# Patient Record
Sex: Female | Born: 1960 | Race: Black or African American | Hispanic: No | Smoking: Current some day smoker
Health system: Southern US, Community
[De-identification: ages and names within clinical notes are randomized; demographics above are authoritative.]

## PROBLEM LIST (undated history)

## (undated) DIAGNOSIS — I639 Cerebral infarction, unspecified: Secondary | ICD-10-CM

## (undated) DIAGNOSIS — R911 Solitary pulmonary nodule: Secondary | ICD-10-CM

## (undated) DIAGNOSIS — K219 Gastro-esophageal reflux disease without esophagitis: Secondary | ICD-10-CM

## (undated) DIAGNOSIS — E669 Obesity, unspecified: Secondary | ICD-10-CM

## (undated) DIAGNOSIS — I10 Essential (primary) hypertension: Secondary | ICD-10-CM

## (undated) DIAGNOSIS — I779 Disorder of arteries and arterioles, unspecified: Secondary | ICD-10-CM

## (undated) DIAGNOSIS — E119 Type 2 diabetes mellitus without complications: Secondary | ICD-10-CM

## (undated) DIAGNOSIS — Z8673 Personal history of transient ischemic attack (TIA), and cerebral infarction without residual deficits: Secondary | ICD-10-CM

## (undated) DIAGNOSIS — E782 Mixed hyperlipidemia: Secondary | ICD-10-CM

## (undated) DIAGNOSIS — E559 Vitamin D deficiency, unspecified: Secondary | ICD-10-CM

## (undated) DIAGNOSIS — M1990 Unspecified osteoarthritis, unspecified site: Secondary | ICD-10-CM

## (undated) DIAGNOSIS — I739 Peripheral vascular disease, unspecified: Secondary | ICD-10-CM

## (undated) HISTORY — DX: Type 2 diabetes mellitus without complications: E11.9

## (undated) HISTORY — DX: Vitamin D deficiency, unspecified: E55.9

## (undated) HISTORY — DX: Obesity, unspecified: E66.9

## (undated) HISTORY — PX: TUBAL LIGATION: SHX77

## (undated) HISTORY — DX: Solitary pulmonary nodule: R91.1

## (undated) HISTORY — DX: Personal history of transient ischemic attack (TIA), and cerebral infarction without residual deficits: Z86.73

## (undated) HISTORY — DX: Disorder of arteries and arterioles, unspecified: I77.9

## (undated) HISTORY — DX: Essential (primary) hypertension: I10

## (undated) HISTORY — DX: Mixed hyperlipidemia: E78.2

## (undated) HISTORY — DX: Peripheral vascular disease, unspecified: I73.9

---

## 2012-01-31 ENCOUNTER — Emergency Department: Payer: Self-pay | Admitting: Internal Medicine

## 2012-01-31 LAB — CBC
HCT: 43.7 %
HGB: 14.8 g/dL
MCH: 30.7 pg
MCHC: 33.9 g/dL
MCV: 91 fL
Platelet: 125 x10 3/mm 3 — ABNORMAL LOW
RBC: 4.81 X10 6/mm 3
RDW: 13.1 %
WBC: 3.8 x10 3/mm 3

## 2012-01-31 LAB — URINALYSIS, COMPLETE
Bacteria: NONE SEEN
Bilirubin,UR: NEGATIVE
Blood: NEGATIVE
Glucose,UR: >=500 mg/dL
Ketone: NEGATIVE
Leukocyte Esterase: NEGATIVE
Nitrite: NEGATIVE
Ph: 5
Protein: NEGATIVE
RBC,UR: NONE SEEN /HPF
Specific Gravity: 1.009
Squamous Epithelial: <1
WBC UR: 1 /HPF

## 2012-01-31 LAB — COMPREHENSIVE METABOLIC PANEL WITH GFR
Albumin: 3.9 g/dL
Alkaline Phosphatase: 124 U/L
Anion Gap: 12
BUN: 15 mg/dL
Bilirubin,Total: 0.4 mg/dL
Calcium, Total: 9.4 mg/dL
Chloride: 101 mmol/L
Co2: 21 mmol/L
Creatinine: 0.76 mg/dL
EGFR (African American): >60
EGFR (Non-African Amer.): >60
Glucose: 307 mg/dL — ABNORMAL HIGH
Osmolality: 281
Potassium: 4 mmol/L
SGOT(AST): 21 U/L
SGPT (ALT): 24 U/L
Sodium: 134 mmol/L — ABNORMAL LOW
Total Protein: 8.5 g/dL — ABNORMAL HIGH

## 2012-01-31 LAB — PROTIME-INR
INR: 0.9
Prothrombin Time: 12.5 secs (ref 11.5–14.7)

## 2012-09-20 ENCOUNTER — Inpatient Hospital Stay (HOSPITAL_COMMUNITY)
Admission: EM | Admit: 2012-09-20 | Discharge: 2012-09-22 | DRG: 066 | Disposition: A | Discharge status: Home Health | Payer: Medicaid Other | Attending: Internal Medicine | Admitting: Internal Medicine

## 2012-09-20 ENCOUNTER — Encounter (HOSPITAL_COMMUNITY): Payer: Self-pay | Admitting: Emergency Medicine

## 2012-09-20 ENCOUNTER — Emergency Department (HOSPITAL_COMMUNITY): Payer: Medicaid Other

## 2012-09-20 ENCOUNTER — Inpatient Hospital Stay (HOSPITAL_COMMUNITY): Payer: Medicaid Other

## 2012-09-20 DIAGNOSIS — R209 Unspecified disturbances of skin sensation: Secondary | ICD-10-CM | POA: Diagnosis present

## 2012-09-20 DIAGNOSIS — Z6833 Body mass index (BMI) 33.0-33.9, adult: Secondary | ICD-10-CM

## 2012-09-20 DIAGNOSIS — Z79899 Other long term (current) drug therapy: Secondary | ICD-10-CM

## 2012-09-20 DIAGNOSIS — I63239 Cerebral infarction due to unspecified occlusion or stenosis of unspecified carotid arteries: Principal | ICD-10-CM | POA: Diagnosis present

## 2012-09-20 DIAGNOSIS — R4789 Other speech disturbances: Secondary | ICD-10-CM | POA: Diagnosis present

## 2012-09-20 DIAGNOSIS — Z7982 Long term (current) use of aspirin: Secondary | ICD-10-CM

## 2012-09-20 DIAGNOSIS — E785 Hyperlipidemia, unspecified: Secondary | ICD-10-CM | POA: Diagnosis present

## 2012-09-20 DIAGNOSIS — R29810 Facial weakness: Secondary | ICD-10-CM | POA: Diagnosis present

## 2012-09-20 DIAGNOSIS — I10 Essential (primary) hypertension: Secondary | ICD-10-CM | POA: Diagnosis present

## 2012-09-20 DIAGNOSIS — I6350 Cerebral infarction due to unspecified occlusion or stenosis of unspecified cerebral artery: Secondary | ICD-10-CM

## 2012-09-20 DIAGNOSIS — E669 Obesity, unspecified: Secondary | ICD-10-CM | POA: Diagnosis present

## 2012-09-20 DIAGNOSIS — I639 Cerebral infarction, unspecified: Secondary | ICD-10-CM

## 2012-09-20 DIAGNOSIS — N320 Bladder-neck obstruction: Secondary | ICD-10-CM

## 2012-09-20 DIAGNOSIS — F17200 Nicotine dependence, unspecified, uncomplicated: Secondary | ICD-10-CM | POA: Diagnosis present

## 2012-09-20 DIAGNOSIS — E119 Type 2 diabetes mellitus without complications: Secondary | ICD-10-CM | POA: Diagnosis present

## 2012-09-20 DIAGNOSIS — I6930 Unspecified sequelae of cerebral infarction: Secondary | ICD-10-CM

## 2012-09-20 DIAGNOSIS — R29898 Other symptoms and signs involving the musculoskeletal system: Secondary | ICD-10-CM | POA: Diagnosis present

## 2012-09-20 DIAGNOSIS — I69998 Other sequelae following unspecified cerebrovascular disease: Secondary | ICD-10-CM

## 2012-09-20 HISTORY — DX: Essential (primary) hypertension: I10

## 2012-09-20 HISTORY — DX: Type 2 diabetes mellitus without complications: E11.9

## 2012-09-20 LAB — CBC
Hemoglobin: 11.7 g/dL — ABNORMAL LOW (ref 12.0–15.0)
MCV: 83.7 fL (ref 78.0–100.0)
Platelets: 209 10*3/uL (ref 150–400)
RBC: 4.06 MIL/uL (ref 3.87–5.11)
WBC: 5.4 10*3/uL (ref 4.0–10.5)

## 2012-09-20 LAB — COMPREHENSIVE METABOLIC PANEL
AST: 14 U/L (ref 0–37)
CO2: 25 mEq/L (ref 19–32)
Calcium: 9.5 mg/dL (ref 8.4–10.5)
Chloride: 101 mEq/L (ref 96–112)
Creatinine, Ser: 0.77 mg/dL (ref 0.50–1.10)
GFR calc Af Amer: >90 mL/min (ref >90)
GFR calc non Af Amer: >90 mL/min (ref >90)
Glucose, Bld: 120 mg/dL — ABNORMAL HIGH (ref 70–99)
Total Bilirubin: 0.4 mg/dL (ref 0.3–1.2)

## 2012-09-20 LAB — CBC WITH DIFFERENTIAL/PLATELET
Eosinophils Relative: 1 % (ref 0–5)
HCT: 32.3 % — ABNORMAL LOW (ref 36.0–46.0)
Hemoglobin: 11.2 g/dL — ABNORMAL LOW (ref 12.0–15.0)
Lymphocytes Relative: 34 % (ref 12–46)
Lymphs Abs: 1.7 10*3/uL (ref 0.7–4.0)
MCV: 84.3 fL (ref 78.0–100.0)
Monocytes Absolute: 0.4 10*3/uL (ref 0.1–1.0)
Monocytes Relative: 7 % (ref 3–12)
Neutro Abs: 2.9 10*3/uL (ref 1.7–7.7)
RBC: 3.83 MIL/uL — ABNORMAL LOW (ref 3.87–5.11)
WBC: 5 10*3/uL (ref 4.0–10.5)

## 2012-09-20 LAB — CREATININE, SERUM
Creatinine, Ser: 0.66 mg/dL (ref 0.50–1.10)
GFR calc Af Amer: >90 mL/min (ref >90)

## 2012-09-20 LAB — GLUCOSE, CAPILLARY

## 2012-09-20 LAB — PROTIME-INR: INR: 0.89 (ref 0.00–1.49)

## 2012-09-20 LAB — ETHANOL: Alcohol, Ethyl (B): <11 mg/dL (ref 0–11)

## 2012-09-20 MED ORDER — SIMVASTATIN 20 MG PO TABS
20.0000 mg | ORAL_TABLET | Freq: Every day | ORAL | Status: DC
  Administered 2012-09-21: 20 mg via ORAL
  Filled 2012-09-20 (×2): qty 1

## 2012-09-20 MED ORDER — ACETAMINOPHEN 650 MG RE SUPP: 650.0000 mg | RECTAL | Status: DC | PRN

## 2012-09-20 MED ORDER — LISINOPRIL 10 MG PO TABS
10.0000 mg | ORAL_TABLET | Freq: Every day | ORAL | Status: DC
  Administered 2012-09-21 – 2012-09-22 (×2): 10 mg via ORAL
  Filled 2012-09-20 (×2): qty 1

## 2012-09-20 MED ORDER — ASPIRIN 300 MG RE SUPP
300.0000 mg | Freq: Every day | RECTAL | Status: DC
  Filled 2012-09-20 (×2): qty 1

## 2012-09-20 MED ORDER — INSULIN ASPART 100 UNIT/ML ~~LOC~~ SOLN: 0.0000 [IU] | Freq: Every day | SUBCUTANEOUS | Status: DC

## 2012-09-20 MED ORDER — ENOXAPARIN SODIUM 40 MG/0.4ML ~~LOC~~ SOLN
40.0000 mg | SUBCUTANEOUS | Status: DC
  Administered 2012-09-20 – 2012-09-21 (×2): 40 mg via SUBCUTANEOUS
  Filled 2012-09-20 (×3): qty 0.4

## 2012-09-20 MED ORDER — ONDANSETRON HCL 4 MG/2ML IJ SOLN: 4.0000 mg | Freq: Four times a day (QID) | INTRAMUSCULAR | Status: DC | PRN

## 2012-09-20 MED ORDER — ACETAMINOPHEN 325 MG PO TABS: 650.0000 mg | ORAL_TABLET | ORAL | Status: DC | PRN

## 2012-09-20 MED ORDER — SODIUM CHLORIDE 0.9 % IV SOLN
INTRAVENOUS | Status: AC
  Administered 2012-09-20: 19:00:00 via INTRAVENOUS

## 2012-09-20 MED ORDER — INSULIN ASPART 100 UNIT/ML ~~LOC~~ SOLN
0.0000 [IU] | Freq: Three times a day (TID) | SUBCUTANEOUS | Status: DC
  Administered 2012-09-21: 3 [IU] via SUBCUTANEOUS
  Administered 2012-09-21: 2 [IU] via SUBCUTANEOUS
  Administered 2012-09-21 – 2012-09-22 (×2): 1 [IU] via SUBCUTANEOUS

## 2012-09-20 MED ORDER — GABAPENTIN 100 MG PO CAPS
100.0000 mg | ORAL_CAPSULE | Freq: Three times a day (TID) | ORAL | Status: DC
  Administered 2012-09-20 – 2012-09-22 (×5): 100 mg via ORAL
  Filled 2012-09-20 (×8): qty 1

## 2012-09-20 MED ORDER — ASPIRIN 325 MG PO TABS
325.0000 mg | ORAL_TABLET | Freq: Every day | ORAL | Status: DC
  Administered 2012-09-20 – 2012-09-21 (×2): 325 mg via ORAL
  Filled 2012-09-20 (×2): qty 1

## 2012-09-20 NOTE — Consult Note (Signed)
Referring Physician: ED    Chief Complaint: right face weakness.  HPI:                                                                                                                                         Felicia Chavez is an 52 y.o. 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 She said that she didn't have a headache, vertigo, double vision, worsening speech, numbness, increasing right arm weakness or right leg weakness. She stopped taking her aspirin for an unknown period of time. Upon arrival to the ED an urgent CT brain showed an area of low attenuation involving the right frontal area, and a subsequent MRI-DWI brain revealed an acute infarct left frontal white matter and overlying  cortex that extends back to the parietal lobe. Brain MRA showed severe stenosis of the supraclinoid internal carotid artery on the  left with decreased flow in the left M1 and A1 segments.  No other complains today.      LSN: 11: am today. tPA Given: no a candidate due to late presentation.  Past Medical History  Diagnosis Date  . Hypertension   . Diabetes mellitus without complication     History reviewed. No pertinent past surgical history.  History reviewed. No pertinent family history. Social History:  reports that she has never smoked. She does not have any smokeless tobacco history on file. She reports that she does not drink alcohol or use illicit drugs.  Allergies: No Known Allergies  Medications:                                                                                                                           I have reviewed the patient's current  medications.  ROS:  History obtained from the patient and family members.  General ROS: negative for - chills, fatigue, fever, night sweats, weight gain or weight loss Psychological ROS: negative for - behavioral disorder, hallucinations, memory difficulties, mood swings or suicidal ideation Ophthalmic ROS: negative for - blurry vision, double vision, eye pain or loss of vision ENT ROS: negative for - epistaxis, nasal discharge, oral lesions, sore throat, tinnitus or vertigo Allergy and Immunology ROS: negative for - hives or itchy/watery eyes Hematological and Lymphatic ROS: negative for - bleeding problems, bruising or swollen lymph nodes Endocrine ROS: negative for - galactorrhea, hair pattern changes, polydipsia/polyuria or temperature intolerance Respiratory ROS: negative for - cough, hemoptysis, shortness of breath or wheezing Cardiovascular ROS: negative for - chest pain, dyspnea on exertion, edema or irregular heartbeat Gastrointestinal ROS: negative for - abdominal pain, diarrhea, hematemesis, nausea/vomiting or stool incontinence Genito-Urinary ROS: negative for - dysuria, hematuria, incontinence or urinary frequency/urgency Musculoskeletal ROS: negative for - joint swelling or muscular weakness Neurological ROS: as noted in HPI Dermatological ROS: negative for rash and skin lesion changes.  Physical exam: pleasant, no distress.Blood pressure 132/69, pulse 94, temperature 98.4 F (36.9 C), temperature source Oral, resp. rate 20, SpO2 100.00%. Head: normocephalic. Neck: supple. Cardiac: no murmurs. Lungs: clear. Abdomen: soft. Extremities: no edema.  Neurologic Examination:                                                                                                      Mental Status: Alert, oriented, thought content appropriate. No aphasia  but subtle dysarthria. Able to follow 3 step commands without difficulty. Cranial Nerves: II: Discs flat bilaterally; Visual fields grossly normal, pupils equal, round, reactive to light and accommodation III,IV, VI: ptosis not present, extra-ocular motions intact bilaterally V: facial light touch sensation normal bilaterally. VII: right facial weakness, central type. VIII: hearing normal bilaterally IX,X: gag reflex present XI: bilateral shoulder shrug XII: midline tongue extension Motor: Right : mild right hand weakness.   Left:     Upper extremity   5/5 Lower extremity   5/5     Left lower extremity   5/5 Tone and bulk:normal tone throughout; no atrophy noted Sensory: Pinprick and light touch intact throughout, bilaterally Deep Tendon Reflexes: 2+ and symmetric throughout Plantars: Right: downgoing   Left: downgoing Cerebellar: normal finger-to-nose,  normal heel-to-shin test Gait: no tested. CV: pulses palpable throughout      Results for orders placed during the hospital encounter of 09/20/12 (from the past 48 hour(s))  GLUCOSE, CAPILLARY     Status: Abnormal   Collection Time   09/20/12 12:14 PM      Component Value Range Comment   Glucose-Capillary 126 (*) 70 - 99 mg/dL    Comment 1 Notify RN     CBC WITH DIFFERENTIAL     Status: Abnormal   Collection Time   09/20/12 12:47 PM      Component Value Range Comment   WBC 5.0  4.0 - 10.5 K/uL    RBC 3.83 (*) 3.87 - 5.11 MIL/uL    Hemoglobin 11.2 (*) 12.0 - 15.0  g/dL    HCT 16.1 (*) 09.6 - 46.0 %    MCV 84.3  78.0 - 100.0 fL    MCH 29.2  26.0 - 34.0 pg    MCHC 34.7  30.0 - 36.0 g/dL    RDW 04.5  40.9 - 81.1 %    Platelets 196  150 - 400 K/uL    Neutrophils Relative 58  43 - 77 %    Neutro Abs 2.9  1.7 - 7.7 K/uL    Lymphocytes Relative 34  12 - 46 %    Lymphs Abs 1.7  0.7 - 4.0 K/uL    Monocytes Relative 7  3 - 12 %    Monocytes Absolute 0.4  0.1 - 1.0 K/uL    Eosinophils Relative 1  0 - 5 %    Eosinophils Absolute 0.1   0.0 - 0.7 K/uL    Basophils Relative 0  0 - 1 %    Basophils Absolute 0.0  0.0 - 0.1 K/uL   COMPREHENSIVE METABOLIC PANEL     Status: Abnormal   Collection Time   09/20/12 12:47 PM      Component Value Range Comment   Sodium 138  135 - 145 mEq/L    Potassium 3.9  3.5 - 5.1 mEq/L    Chloride 101  96 - 112 mEq/L    CO2 25  19 - 32 mEq/L    Glucose, Bld 120 (*) 70 - 99 mg/dL    BUN 19  6 - 23 mg/dL    Creatinine, Ser 9.14  0.50 - 1.10 mg/dL    Calcium 9.5  8.4 - 78.2 mg/dL    Total Protein 7.4  6.0 - 8.3 g/dL    Albumin 3.9  3.5 - 5.2 g/dL    AST 14  0 - 37 U/L    ALT 14  0 - 35 U/L    Alkaline Phosphatase 68  39 - 117 U/L    Total Bilirubin 0.4  0.3 - 1.2 mg/dL    GFR calc non Af Amer >90  >90 mL/min    GFR calc Af Amer >90  >90 mL/min   PROTIME-INR     Status: Normal   Collection Time   09/20/12 12:47 PM      Component Value Range Comment   Prothrombin Time 12.0  11.6 - 15.2 seconds    INR 0.89  0.00 - 1.49   AMMONIA     Status: Normal   Collection Time   09/20/12  1:13 PM      Component Value Range Comment   Ammonia 36  11 - 60 umol/L   ETHANOL     Status: Normal   Collection Time   09/20/12  1:14 PM      Component Value Range Comment   Alcohol, Ethyl (B) <11  0 - 11 mg/dL   GLUCOSE, CAPILLARY     Status: Abnormal   Collection Time   09/20/12  4:53 PM      Component Value Range Comment   Glucose-Capillary 112 (*) 70 - 99 mg/dL    Comment 1 Notify RN      Comment 2 Documented in Chart     CBC     Status: Abnormal   Collection Time   09/20/12  6:10 PM      Component Value Range Comment   WBC 5.4  4.0 - 10.5 K/uL    RBC 4.06  3.87 - 5.11 MIL/uL    Hemoglobin 11.7 (*)  12.0 - 15.0 g/dL    HCT 09.8 (*) 11.9 - 46.0 %    MCV 83.7  78.0 - 100.0 fL    MCH 28.8  26.0 - 34.0 pg    MCHC 34.4  30.0 - 36.0 g/dL    RDW 14.7  82.9 - 56.2 %    Platelets 209  150 - 400 K/uL    Ct Head Wo Contrast  09/20/2012  *RADIOLOGY REPORT*  Clinical Data: Right facial droop.  CT HEAD WITHOUT  CONTRAST  Technique:  Contiguous axial images were obtained from the base of the skull through the vertex without contrast.  Comparison: None.  Findings: There is focal area of mildly decreased attenuation in the left frontal deep white matter, extending to the cortex (example image 11).  Mild asymmetric edema is seen in the high left frontal lobe, including the cortex, with extension to the left parietal lobe.  No associated acute hemorrhage.  No evidence of mass lesion, mass effect or hydrocephalus.  Visualized portions of the paranasal sinuses and mastoid air cells are clear.  IMPRESSION:  Low attenuation involving primarily the left frontal lobe, with some extension to the left parietal lobe, worrisome for acute or subacute infarction.  MR brain without and with contrast, including MR angiography, recommended. Critical Value/emergent results were called by telephone at the time of interpretation on 09/20/2012 at 1255 hours to Dr. Azalia Bilis, who verbally acknowledged these results.   Original Report Authenticated By: Leanna Battles, M.D.    Mr [facility] Wo Contrast  09/20/2012  *RADIOLOGY REPORT*  Clinical Data:  Stroke.  Hypertension, diabetes, hyperlipidemia. Right facial droop.  MRI HEAD WITHOUT CONTRAST MRA HEAD WITHOUT CONTRAST  Technique:  Multiplanar, multiecho pulse sequences of the brain and surrounding structures were obtained without intravenous contrast. Angiographic images of the head were obtained using MRA technique without contrast.  Comparison:  CT 09/20/2012  MRI HEAD  Findings:  Acute infarct left frontal white matter and overlying cortex.  This extends back to the parietal lobe.  There is also a chronic infarct in the left parietal lobe with cortical volume loss.  Minimal chronic hemorrhage is present in the chronic left parietal infarct.  No other acute infarct.  Ventricle size is normal.  No mass lesion.  Brainstem and cerebellum are intact.  Negative for Chiari malformation. Pituitary  is normal in size.  Paranasal sinuses are clear.  IMPRESSION: Acute infarct left frontal lobe.  Chronic infarct left parietal lobe.  MRA HEAD  Findings: Both vertebral arteries are patent to the basilar.  The basilar is widely patent.  Posterior cerebral arteries are patent bilaterally.  Left posterior communicating artery is patent.  The right posterior communicating artery is not patent.  Severe stenosis of the distal left internal carotid artery with decreased flow in the left A1 and left M1 segments.  There is poor visualization of left middle cerebral artery branches which may be due to chronic occlusion or slow flow.  The anterior communicating artery is patent and supplies the left anterior cerebral artery which is patent.  Mild stenosis of the right supraclinoid internal carotid artery. Right anterior and middle cerebral arteries are patent.  Negative for cerebral aneurysm.  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   Original Report Authenticated By: Janeece Riggers, M.D.    Mr Brain Wo Contrast  09/20/2012  *RADIOLOGY REPORT*  Clinical Data:  Stroke.  Hypertension, diabetes,  hyperlipidemia. Right facial droop.  MRI HEAD WITHOUT CONTRAST MRA HEAD WITHOUT CONTRAST  Technique:  Multiplanar, multiecho pulse sequences of the brain and surrounding structures were obtained without intravenous contrast. Angiographic images of the head were obtained using MRA technique without contrast.  Comparison:  CT 09/20/2012  MRI HEAD  Findings:  Acute infarct left frontal white matter and overlying cortex.  This extends back to the parietal lobe.  There is also a chronic infarct in the left parietal lobe with cortical volume loss.  Minimal chronic hemorrhage is present in the chronic left parietal infarct.  No other acute infarct.  Ventricle size is normal.  No mass lesion.  Brainstem and cerebellum are intact.  Negative for Chiari  malformation. Pituitary is normal in size.  Paranasal sinuses are clear.  IMPRESSION: Acute infarct left frontal lobe.  Chronic infarct left parietal lobe.  MRA HEAD  Findings: Both vertebral arteries are patent to the basilar.  The basilar is widely patent.  Posterior cerebral arteries are patent bilaterally.  Left posterior communicating artery is patent.  The right posterior communicating artery is not patent.  Severe stenosis of the distal left internal carotid artery with decreased flow in the left A1 and left M1 segments.  There is poor visualization of left middle cerebral artery branches which may be due to chronic occlusion or slow flow.  The anterior communicating artery is patent and supplies the left anterior cerebral artery which is patent.  Mild stenosis of the right supraclinoid internal carotid artery. Right anterior and middle cerebral arteries are patent.  Negative for cerebral aneurysm.  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   Original Report Authenticated By: Janeece Riggers, M.D.       Assessment: 52 y.o. female with multiple risk factors for stroke, admitted with new onset right facial weakness in the setting of an acute right frontal-parietal cortex infarct. Needs to complete stroke work. Resume aspirin 325 mg daily pending results stroke work up. Stroke team to resume care in the morning and make further recommendations.  Ermalene Postin  [facility] [phone]  09/20/2012, 7:04 PM

## 2012-09-20 NOTE — Progress Notes (Signed)
Felicia Chavez was received from ER; RN reported passed swallow screen; diet ordered.  Oriented to room and unit. Stroke education begun. Son at bedside. No c/o pain; no acute distress.  VSS

## 2012-09-20 NOTE — ED Provider Notes (Addendum)
History     CSN: [id number]  Arrival date & time 09/20/12  1131   First MD Initiated Contact with Patient 09/20/12 1220      Chief Complaint  Patient presents with  . Facial Droop    (Consider location/radiation/quality/duration/timing/severity/associated sxs/prior treatment) HPI  Patient with stroke in June.  Today with new onset right sided facial droop noted on awakening today.  NKN last night at 10 p.m.  Lives with daughter and she noted facial droop this a.m. She has residual right upper extremity weakness that is no worse than usual.    Past Medical History  Diagnosis Date  . Hypertension   . Diabetes mellitus without complication     History reviewed. No pertinent past surgical history.  History reviewed. No pertinent family history.  History  Substance Use Topics  . Smoking status: Never Smoker   . Smokeless tobacco: Not on file  . Alcohol Use: No    OB History    Grav Para Term Preterm Abortions TAB SAB Ect Mult Living                  Review of Systems  Constitutional: Negative.   HENT: Negative.   Eyes: Negative for visual disturbance.  Respiratory: Negative.   Cardiovascular: Negative.   Gastrointestinal: Negative.   Genitourinary: Negative.   Musculoskeletal: Negative.   Neurological: Negative.   Hematological: Negative.   Psychiatric/Behavioral: Negative.   All other systems reviewed and are negative.    Allergies  Review of patient's allergies indicates no known allergies.  Home Medications   Current Outpatient Rx  Name  Route  Sig  Dispense  Refill  . ASPIRIN EC 81 MG PO TBEC   Oral   Take 81 mg by mouth daily.         Marland Kitchen PRAVASTATIN SODIUM PO   Oral   Take by mouth.           BP 126/82  Pulse 101  Temp 99.2 F (37.3 C) (Oral)  Resp 20  SpO2 98%  Physical Exam  Nursing note and vitals reviewed. Constitutional: She is oriented to person, place, and time. She appears well-developed and well-nourished.  HENT:  Head:  Normocephalic and atraumatic.  Right Ear: External ear normal.  Left Ear: External ear normal.  Nose: Nose normal.  Mouth/Throat: Oropharynx is clear and moist.  Eyes: Conjunctivae normal and EOM are normal. Pupils are equal, round, and reactive to light.  Neck: Normal range of motion. Neck supple.  Cardiovascular: Normal rate, regular rhythm, normal heart sounds and intact distal pulses.   Pulmonary/Chest: Effort normal and breath sounds normal.  Abdominal: Soft. Bowel sounds are normal.  Musculoskeletal: Normal range of motion.  Neurological: She is alert and oriented to person, place, and time. She has normal reflexes.       Rue 4/5 facial droop on exam.   Stroke scale with facial palsy 3, right arm drift, limb ataxia  Skin: Skin is warm and dry.  Psychiatric: She has a normal mood and affect. Her behavior is normal. Judgment and thought content normal.    ED Course  Procedures (including critical care time)   Labs Reviewed  CBC WITH DIFFERENTIAL  COMPREHENSIVE METABOLIC PANEL  PROTIME-INR   Ct Head Wo Contrast  09/20/2012  *RADIOLOGY REPORT*  Clinical Data: Right facial droop.  CT HEAD WITHOUT CONTRAST  Technique:  Contiguous axial images were obtained from the base of the skull through the vertex without contrast.  Comparison: None.  Findings:  There is focal area of mildly decreased attenuation in the left frontal deep white matter, extending to the cortex (example image 11).  Mild asymmetric edema is seen in the high left frontal lobe, including the cortex, with extension to the left parietal lobe.  No associated acute hemorrhage.  No evidence of mass lesion, mass effect or hydrocephalus.  Visualized portions of the paranasal sinuses and mastoid air cells are clear.  IMPRESSION:  Low attenuation involving primarily the left frontal lobe, with some extension to the left parietal lobe, worrisome for acute or subacute infarction.  MR brain without and with contrast, including MR  angiography, recommended. Critical Value/emergent results were called by telephone at the time of interpretation on 09/20/2012 at 1255 hours to Dr. Azalia Bilis, who verbally acknowledged these results.   Original Report Authenticated By: Leanna Battles, M.D.      No diagnosis found.  Results for orders placed during the hospital encounter of 09/20/12  CBC WITH DIFFERENTIAL      Component Value Range   WBC 5.0  4.0 - 10.5 K/uL   RBC 3.83 (*) 3.87 - 5.11 MIL/uL   Hemoglobin 11.2 (*) 12.0 - 15.0 g/dL   HCT 11.9 (*) 14.7 - 82.9 %   MCV 84.3  78.0 - 100.0 fL   MCH 29.2  26.0 - 34.0 pg   MCHC 34.7  30.0 - 36.0 g/dL   RDW 56.2  13.0 - 86.5 %   Platelets 196  150 - 400 K/uL   Neutrophils Relative 58  43 - 77 %   Neutro Abs 2.9  1.7 - 7.7 K/uL   Lymphocytes Relative 34  12 - 46 %   Lymphs Abs 1.7  0.7 - 4.0 K/uL   Monocytes Relative 7  3 - 12 %   Monocytes Absolute 0.4  0.1 - 1.0 K/uL   Eosinophils Relative 1  0 - 5 %   Eosinophils Absolute 0.1  0.0 - 0.7 K/uL   Basophils Relative 0  0 - 1 %   Basophils Absolute 0.0  0.0 - 0.1 K/uL  COMPREHENSIVE METABOLIC PANEL      Component Value Range   Sodium 138  135 - 145 mEq/L   Potassium 3.9  3.5 - 5.1 mEq/L   Chloride 101  96 - 112 mEq/L   CO2 25  19 - 32 mEq/L   Glucose, Bld 120 (*) 70 - 99 mg/dL   BUN 19  6 - 23 mg/dL   Creatinine, Ser 7.84  0.50 - 1.10 mg/dL   Calcium 9.5  8.4 - 69.6 mg/dL   Total Protein 7.4  6.0 - 8.3 g/dL   Albumin 3.9  3.5 - 5.2 g/dL   AST 14  0 - 37 U/L   ALT 14  0 - 35 U/L   Alkaline Phosphatase 68  39 - 117 U/L   Total Bilirubin 0.4  0.3 - 1.2 mg/dL   GFR calc non Af Amer >90  >90 mL/min   GFR calc Af Amer >90  >90 mL/min  PROTIME-INR      Component Value Range   Prothrombin Time 12.0  11.6 - 15.2 seconds   INR 0.89  0.00 - 1.49  ETHANOL      Component Value Range   Alcohol, Ethyl (B) <11  0 - 11 mg/dL  AMMONIA      Component Value Range   Ammonia 36  11 - 60 umol/L  GLUCOSE, CAPILLARY       Component Value Range  Glucose-Capillary 126 (*) 70 - 99 mg/dL   Comment 1 Notify RN       MDM  1-cva-Patient with new stroke but is not a candidate for lytics as last known normal greater than 12 hours.  Discussed with hospitalist and neurology and patient will be admitted to Dr. Ozella Almond team.  Plan admission and he will be to assess.  2-diabetes- blood sugar at 120 here 3-hypertension- well controlled here.    Attempting to obtain records from [place], [place], and [place].  Patient continues with normal bp and neuro exam with right facial droop.  Patient and family state walking without difficulty, not tested here.     Hilario Quarry, MD 09/20/12 1443  Hilario Quarry, MD 09/20/12 1445  Dc summary from [facility] reviewed.   Hilario Quarry, MD  D/c summary from [place] obtained   CRITICAL CARE Performed by: Hilario Quarry   Total critical care time: 50  Critical care time was exclusive of separately billable procedures and treating other patients.  Critical care was necessary to treat or prevent imminent or life-threatening deterioration.  Critical care was time spent personally by me on the following activities: development of treatment plan with patient and/or surrogate as well as nursing, discussions with consultants, evaluation of patient's response to treatment, examination of patient, obtaining history from patient or surrogate, ordering and performing treatments and interventions, ordering and review of laboratory studies, ordering and review of radiographic studies, pulse oximetry and re-evaluation of patient's condition.  09/20/12 1524  Hilario Quarry, MD 09/21/12 1610

## 2012-09-20 NOTE — ED Notes (Signed)
Patient to MRI at this time.

## 2012-09-20 NOTE — H&P (Signed)
[facility] History and Physical  Felicia Chavez WUJ:[id number] DOB: 25-Jun-1961 DOA: 09/20/2012  Referring physician: Dr. Rosalia Hammers PCP: No primary provider on file.  Specialists: neurology has been consulted (stroke service will follow in am)  Chief Complaint: right facial droop  HPI: Felicia Chavez is a 52 y.o. female with past medical history significant for hypertension, diabetes, hyperlipidemia and preview stroke in June 2013 (with residual right upper extremity weakness); came to the hospital secondary to new onset of facial droop (right side). Family members reports that the patient was last seen normal are all 10 PM last night before she went to bed, this morning when she will call her daughter noticed that she was having right facial droop; patient was brought to the emergency department but because she was out of the therapeutic window no code stroke was activated. A CT scan of the head demonstrated low attenuation affecting left frontal lobe with extension into the left parietal lobe. Patient denies any fever, chills, abdominal pain, dysuria, nausea/vomiting, chest pain, diaphoresis, cough, melena or hematochezia. [facility] has been called to admit the patient for further evaluation and treatment. Of note patient with a baseline right upper extremity weakness from previous stroke that has been unchanged; she also denies any other deficit or focal neurologic abnormality at this moment.   Review of Systems:  Negative except as otherwise mentioned on history of present illness.  Past Medical History  Diagnosis Date  . Hypertension   . Diabetes mellitus without complication    History reviewed. No pertinent past surgical history.  Social History:  reports that she has never smoked. She does not have any smokeless tobacco history on file. She reports that she does not drink alcohol or use illicit drugs. patient lives at home with her daughter. Reports no need of assistance with  activities of daily living prior to admission.   No Known Allergies  Family history: Significant for hypertension. No other relevant problems according to patient and family members.  Prior to Admission medications   Medication Sig Start Date End Date Taking? Authorizing Provider  aspirin EC 81 MG tablet Take 81 mg by mouth daily.   Yes Historical Provider, MD  gabapentin (NEURONTIN) 100 MG capsule Take 100 mg by mouth 3 (three) times daily.   Yes Historical Provider, MD  glipiZIDE (GLUCOTROL XL) 5 MG 24 hr tablet Take 5 mg by mouth daily.   Yes Historical Provider, MD  lisinopril (PRINIVIL,ZESTRIL) 10 MG tablet Take 10 mg by mouth daily.   Yes Historical Provider, MD  metFORMIN (GLUCOPHAGE) 1000 MG tablet Take 1,000 mg by mouth 2 (two) times daily with a meal.   Yes Historical Provider, MD  pravastatin (PRAVACHOL) 40 MG tablet Take 40 mg by mouth daily.   Yes Historical Provider, MD   Physical Exam: Filed Vitals:   09/20/12 1145  BP: 126/82  Pulse: 101  Temp: 99.2 F (37.3 C)  TempSrc: Oral  Resp: 20  SpO2: 98%     General:  Right facial droop, no dysarthria, no acute distress, afebrile; cooperative with examination.  Eyes: No icterus, no nystagmus, PERRLA, extraocular muscles intact.  ENT: No erythema or exudates inside her mouth, poor dentition, no drainage out of her ears or nostrils; no hearing deficit.  Neck: Supple, full range of motion, no bruits, no thyromegaly.  Cardiovascular: no murmurs, no rubs, no gallops, S1 and S2 appreciated; mild tachycardia.  Respiratory: clear to auscultation, no crackles, no rhonchi  Abdomen: soft, nondistended, no tenderness on palpation; positive  bowel sounds, no guarding  Skin: no rash, no petechiae  Musculoskeletal: full range of motion, no joint swelling or erythema  Psychiatric: flat affect, otherwise mood is appropriate.  Neurologic: alert, awake and oriented x3, told midline, no uvula deviation; right facial droop  appreciated on exam, the right upper extremity with a muscle strength 4/5; rest of muscle strength exam 5 out of 5 bilaterally symmetrically. Normal deep tendon reflexes, normal light touch and pinprick evaluation throughout. Gait not assess.   Labs on Admission:  Basic Metabolic Panel:  Lab 09/20/12 4540  NA 138  K 3.9  CL 101  CO2 25  GLUCOSE 120*  BUN 19  CREATININE 0.77  CALCIUM 9.5  MG --  PHOS --   Liver Function Tests:  Lab 09/20/12 1247  AST 14  ALT 14  ALKPHOS 68  BILITOT 0.4  PROT 7.4  ALBUMIN 3.9    Lab 09/20/12 1313  AMMONIA 36   CBC:  Lab 09/20/12 1247  WBC 5.0  NEUTROABS 2.9  HGB 11.2*  HCT 32.3*  MCV 84.3  PLT 196   CBG:  Lab 09/20/12 1214  GLUCAP 126*    Radiological Exams on Admission: Ct Head Wo Contrast  09/20/2012  *RADIOLOGY REPORT*  Clinical Data: Right facial droop.  CT HEAD WITHOUT CONTRAST  Technique:  Contiguous axial images were obtained from the base of the skull through the vertex without contrast.  Comparison: None.  Findings: There is focal area of mildly decreased attenuation in the left frontal deep white matter, extending to the cortex (example image 11).  Mild asymmetric edema is seen in the high left frontal lobe, including the cortex, with extension to the left parietal lobe.  No associated acute hemorrhage.  No evidence of mass lesion, mass effect or hydrocephalus.  Visualized portions of the paranasal sinuses and mastoid air cells are clear.  IMPRESSION:  Low attenuation involving primarily the left frontal lobe, with some extension to the left parietal lobe, worrisome for acute or subacute infarction.  MR brain without and with contrast, including MR angiography, recommended. Critical Value/emergent results were called by telephone at the time of interpretation on 09/20/2012 at 1255 hours to Dr. Azalia Bilis, who verbally acknowledged these results.   Original Report Authenticated By: Leanna Battles, M.D.      Assessment/Plan 1-Facial droop: New onset suggested a car out of 10 PM. CT of head demonstrated low attenuation involving primarily the left frontal lobe with extension to the left parietal lobe. Giving high concerns for acute/subacute ischemic stroke. Risk factors include hypertension, diabetes, hyperlipidemia and the fact patient has not been compliant with her aspirin use.  -Will admit to telemetry -A stroke workup as per protocol (MRI, MRA, carotid Dopplers, 2-D echo, lipid panel) -Exactly failure to aspirin as the patient has not been compliant; will use aspirin for secondary prevention at this point. Neurology has been consulted and will follow any further recommendations for changes on her treatment. -Continue risk factor modifications. -PT/OT/speech therapy  2-HTN (hypertension): Currently stable. Will continue home medications. Permissive in the next 24 hours with hypertension. Adjust medications as needed.  3-Diabetes: Will hold metformin and glipizide at this moment. Check hemoglobin A1c and use sliding scale insulin.  4-HLD (hyperlipidemia): Continue statins. Will check lipid panel.  5-H/O: stroke with residual effects: Patient with residual right upper extremity weakness from his stroke in June 2013; will change according to patient. Secondary prevention as mentioned above.  6-DVT: Lovenox  Consultations: Neurology (stroke service)  Code Status: Full  Family Communication: Daughter and son at bedside Disposition Plan: admit to telemetry for acute stroke work up; LOS > 2 midnights  Time spent: > 30 minutes  Kaysin Brock [facility] Pager [phone]  If 7PM-7AM, please contact night-coverage [email or web address] Password [email or web address] 09/20/2012, 2:50 PM

## 2012-09-20 NOTE — ED Notes (Signed)
Pt sts right sided facial droop starting last night; no other neuro deficits noted; pt denies other sx

## 2012-09-21 LAB — LIPID PANEL
LDL Cholesterol: 96 mg/dL (ref 0–99)
VLDL: 41 mg/dL — ABNORMAL HIGH (ref 0–40)

## 2012-09-21 LAB — TSH: TSH: 1.206 u[IU]/mL (ref 0.350–4.500)

## 2012-09-21 LAB — GLUCOSE, CAPILLARY: Glucose-Capillary: 203 mg/dL — ABNORMAL HIGH (ref 70–99)

## 2012-09-21 LAB — HEMOGLOBIN A1C: Hgb A1c MFr Bld: 7.1 % — ABNORMAL HIGH (ref <5.7)

## 2012-09-21 MED ORDER — CLOPIDOGREL BISULFATE 75 MG PO TABS
75.0000 mg | ORAL_TABLET | Freq: Every day | ORAL | Status: DC
  Administered 2012-09-22: 75 mg via ORAL
  Filled 2012-09-21: qty 1

## 2012-09-21 MED ORDER — ASPIRIN EC 81 MG PO TBEC: 81.0000 mg | DELAYED_RELEASE_TABLET | Freq: Every day | ORAL | Status: DC

## 2012-09-21 MED ORDER — ASPIRIN EC 81 MG PO TBEC
81.0000 mg | DELAYED_RELEASE_TABLET | Freq: Every day | ORAL | Status: DC
  Filled 2012-09-21: qty 1

## 2012-09-21 NOTE — Interval H&P Note (Cosign Needed)
History and Physical Interval Note: Agree with physical exam findings. Mild tachycardia on exam. Alert and oriented x 3. Family members presents - discussed findings on MRA and necessity for cerebral angiogram for clarification of intracranial stenosis to see if intervention is an option for her. Cerebral angiogram procedure details, risks and benefits discussed with patient and family with all their questions answered to their satisfaction. Patient verbalized her understanding as well as consent to proceed tomorrow am for angiogram.   09/21/2012 1:23 PM  Felicia Chavez  has presented today for surgery, with the diagnosis of  Stroke and intracranial stenosis*  The various methods of treatment have been discussed with the patient and family. After consideration of risks, benefits and other options for treatment, the patient has consented to cerebral angiogram as a surgical intervention .  The patient's history has been reviewed, patient examined, no change in status, stable for surgery.  I have reviewed the patient's chart and labs.  Questions were answered to the patient's satisfaction.     Elinore Shults D, PA-C Dr. Julieanne Cotton Neuro[facility]ventional [facility]iology

## 2012-09-21 NOTE — Progress Notes (Signed)
*  PRELIMINARY RESULTS* Echocardiogram 2D Echocardiogram has been performed.  Felicia Chavez 09/21/2012, 11:14 AM

## 2012-09-21 NOTE — Progress Notes (Signed)
Stroke Team Progress Note  HISTORY Felicia Chavez is an 52 y.o. 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  She said that she didn't have a headache, vertigo, double vision, worsening speech, numbness, increasing right arm weakness or right leg weakness. She stopped taking her aspirin for an unknown period of time.   Upon arrival to the ED an urgent CT brain showed an area of low attenuation involving the right frontal area, and a subsequent MRI-DWI brain revealed an acute infarct left frontal white matter and overlying cortex that extends back to the parietal lobe. Brain MRA showed severe stenosis of the supraclinoid internal carotid artery on the left with decreased flow in the left M1 and A1 segments. No other complains today.  Patient was not a TPA candidate secondary to delay in arrival. She was admitted for further evaluation and treatment.  SUBJECTIVE Her family is at the bedside.  Overall she feels her condition is gradually improving. She would like to go home. Patient was in [place] for her last stroke.  OBJECTIVE Most recent Vital Signs: Filed Vitals:   09/20/12 2300 09/21/12 0100 09/21/12 0300 09/21/12 0525  BP: 94/50 105/53 112/62 115/67  Pulse: 88 79 79 77  Temp: 97.9 F (36.6 C) 97.6 F (36.4 C) 97.6 F (36.4 C) 98 F (36.7 C)  TempSrc: Oral Oral Oral Oral  Resp: 20 20 20 20   SpO2: 98% 98% 100% 99%   CBG (last 3)   Basename 09/21/12 0646 09/20/12 2125 09/20/12 1653  GLUCAP 164* 205* 112*    IV Fluid Intake:     MEDICATIONS    . aspirin  300 mg Rectal Daily   Or  . aspirin  325 mg Oral Daily  . enoxaparin (LOVENOX) injection  40  mg Subcutaneous Q24H  . gabapentin  100 mg Oral TID  . insulin aspart  0-5 Units Subcutaneous QHS  . insulin aspart  0-9 Units Subcutaneous TID WC  . lisinopril  10 mg Oral Daily  . simvastatin  20 mg Oral q1800   PRN:  acetaminophen, acetaminophen, ondansetron (ZOFRAN) IV  Diet:  Carb Control thin liquids Activity:  Bedrest and OOB with assistance DVT Prophylaxis:  Lovenox 40 mg sq daily   CLINICALLY SIGNIFICANT STUDIES Basic Metabolic Panel:  Lab 09/20/12 1610 09/20/12 1247  NA -- 138  K -- 3.9  CL -- 101  CO2 -- 25  GLUCOSE -- 120*  BUN -- 19  CREATININE 0.66 0.77  CALCIUM -- 9.5  MG -- --  PHOS -- --   Liver Function Tests:  Lab 09/20/12 1247  AST 14  ALT 14  ALKPHOS 68  BILITOT 0.4  PROT 7.4  ALBUMIN 3.9   CBC:  Lab 09/20/12 1810 09/20/12 1247  WBC 5.4 5.0  NEUTROABS -- 2.9  HGB 11.7* 11.2*  HCT 34.0* 32.3*  MCV 83.7 84.3  PLT 209 196   Coagulation:  Lab 09/20/12 1247  LABPROT 12.0  INR 0.89   Cardiac Enzymes: No results found for this basename: CKTOTAL:3,CKMB:3,CKMBINDEX:3,TROPONINI:3 in the last 168 hours Urinalysis: No results found for this basename: COLORURINE:2,APPERANCEUR:2,LABSPEC:2,PHURINE:2,GLUCOSEU:2,HGBUR:2,BILIRUBINUR:2,KETONESUR:2,PROTEINUR:2,UROBILINOGEN:2,NITRITE:2,LEUKOCYTESUR:2 in the last 168 hours Lipid Panel    Component Value Date/Time  CHOL 182 09/21/2012 0650   TRIG 206* 09/21/2012 0650   HDL 45 09/21/2012 0650   CHOLHDL 4.0 09/21/2012 0650   VLDL 41* 09/21/2012 0650   LDLCALC 96 09/21/2012 0650   HgbA1C  Lab Results  Component Value Date   HGBA1C 7.1* 09/20/2012    Urine Drug Screen:   No results found for this basename: labopia, cocainscrnur, labbenz, amphetmu, thcu, labbarb    Alcohol Level:  Lab 09/20/12 1314  ETH <11   CT of the brain  09/20/2012  Low attenuation involving primarily the left frontal lobe, with some extension to the left parietal lobe, worrisome for acute or subacute infarction.  MR brain without and with  contrast, including MR angiography, recommended.  MRI of the brain  09/20/2012   Acute infarct left frontal lobe.  Chronic infarct left parietal lobe.   MRA of the brain  09/20/2012 Severe stenosis of the supraclinoid internal carotid artery on the left with decreased flow in the left M1 and A1 segments.   2D Echocardiogram    Carotid Doppler  Study was technically limited due to poor visualization of anatomy. There is no obvious evidence of hemodynamically significant internal carotid artery stenosis >40%. The right vertebral demonstrates an atypical antegrade waveform with loss of diastolic flow. The left vertebral artery is patent with antegrade flow.   CXR  09/21/2012 No evidence of active pulmonary disease.     EKG  sinus tachycardia.   Therapy Recommendations no PT, home health OT and ST  Physical Exam   Middle aged african american lady not in distress.Awake alert. Afebrile. Head is nontraumatic. Neck is supple without bruit. Hearing is normal. Cardiac exam no murmur or gallop. Lungs are clear to auscultation. Distal pulses are well felt.  Neurological Exam : awake alert oriented x2. Nonfluent speech with mild expressive language difficulties but good comprehension naming and repetition. Left gaze preference but is able to look to the right past midline. Decreased blink to threat on the right. Mild right lower facial weakness. Tongue is midline. Mild right lower extremity drift. Mild right grip weakness and diminished fine finger movements on the right. Orbits left over right upper extremity. Diminished sensation on the right. Coordination is slightly impaired on the right. Gait was not tested. ASSESSMENT Ms. Felicia Chavez is a 52 y.o. female presenting with left facial droop and language abnormality. Imaging confirms a left frontal lobe infarct. Infarct felt to be thrombotic secondary to severe stenosis of the left supraclinoid internal carotid artery with decreased flow in the left M1 and A1.   On aspirin 81 mg orally every day prior to admission. Now on aspirin 325 mg orally every day for secondary stroke prevention. Patient with resultant expressive aphasia, neuro neglect. Work up underway.   Cigarette smoker Hyperlipidemia, LDL 96, on statin PTA, on statin now, goal LDL < 100 Diabetes, HgbA1c 7.1 Obesity, There is no height or weight on file to calculate BMI.  Hospital day # 1  TREATMENT/PLAN  Change to  aspirin 81 mg orally every day and clopidogrel 75 mg orally every day for secondary stroke prevention given her severe intracranial atherosclerosis for at least 3 months.  Daughter concerned with cost of Rx. Will get care manager consult to assist.  Cerebral angio today to evaluate vasculature for possible intervention. Patient made NPO.  Dr. Pearlean Brownie discussed with Dr. Corliss Skains.  Annie Main, MSN, RN, ANVP-BC, ANP-BC, GNP-BC [facility] Pager: [phone] 09/21/2012 11:49 AM  I have personally obtained a history, examined  the patient, evaluated imaging results, and formulated the assessment and plan of care. I agree with the above.  Delia Heady, MD Medical Director [facility] Pager: [phone] 09/21/2012 5:41 PM

## 2012-09-21 NOTE — Evaluation (Signed)
Physical Therapy Evaluation Patient Details Name: Felicia Chavez MRN: [id number] DOB: 18-Jan-1961 Today's Date: 09/21/2012 Time: 6387-5643 PT Time Calculation (min): 30 min  PT Assessment / Plan / Recommendation Clinical Impression  Pt is a pleasent 52 y.o. female s/p left frontal lobe acute stroke and prior hx of chronic parietal stroke.  Pt demonstrates deficits in functional mobility as indicated below in addition to personality changes and difficulty with word finding, initiating and carrying out conversation.  Educated Pt and family regarding personality changes related to stroke including techniques for offering education to family and children about personality changes.  Recommended activities to facilitate engagement.  Will continue to see pt acutely to address deficits and maximize independence for d/c.    PT Assessment  Patient needs continued PT services    Follow Up Recommendations  No PT follow up;Supervision/Assistance - 24 hour (initially)       Barriers to Discharge None      Equipment Recommendations  None recommended by PT    Recommendations for Other Services     Frequency Min 4X/week    Precautions / Restrictions     Pertinent Vitals/Pain No pain at this time      Mobility  Bed Mobility Bed Mobility: Supine to Sit;Sitting - Scoot to Edge of Bed;Sit to Supine Supine to Sit: 5: Supervision;HOB elevated Sitting - Scoot to Edge of Bed: 5: Supervision Sit to Supine: 5: Supervision;HOB elevated Transfers Transfers: Sit to Stand;Stand to Sit Sit to Stand: 5: Supervision;From bed Stand to Sit: 5: Supervision;To bed Ambulation/Gait Ambulation/Gait Assistance: 5: Supervision Ambulation Distance (Feet): 300 Feet Assistive device: None Ambulation/Gait Assistance Details: Pt steady with ambulation, carries RUE in flexion with no RUE arm swing Gait Pattern: Step-through pattern;Narrow base of support Gait velocity: WFL General Gait Details: steady with  gait Stairs: Yes Stairs Assistance: 5: Supervision Stair Management Technique: One rail Right;Forwards;Alternating pattern Number of Stairs: 5  Modified Rankin (Stroke Patients Only) Pre-Morbid Rankin Score: No significant disability Modified Rankin: Moderate disability        PT Diagnosis: Generalized weakness  PT Problem List: Decreased strength;Decreased coordination;Decreased cognition PT Treatment Interventions: DME instruction;Gait training;Stair training;Functional mobility training;Therapeutic activities;Therapeutic exercise;Cognitive remediation;Patient/family education   PT Goals Acute Rehab PT Goals PT Goal Formulation: With patient/family Time For Goal Achievement: 09/28/12 Potential to Achieve Goals: Good Pt will go Supine/Side to Sit: Independently PT Goal: Supine/Side to Sit - Progress: Goal set today Pt will go Sit to Supine/Side: Independently PT Goal: Sit to Supine/Side - Progress: Goal set today Pt will go Sit to Stand: Independently PT Goal: Sit to Stand - Progress: Goal set today Pt will go Stand to Sit: Independently PT Goal: Stand to Sit - Progress: Goal set today Pt will Ambulate: >150 feet;Independently PT Goal: Ambulate - Progress: Goal set today Pt will Go Up / Down Stairs: 6-9 stairs;with modified independence;with rail(s) PT Goal: Up/Down Stairs - Progress: Goal set today  Visit Information  Last PT Received On: 09/21/12 Assistance Needed: +1    Subjective Data  Subjective: Pt very subdued, no initiation of conversation Patient Stated Goal: to gohome with family   Prior Functioning  Home Living Lives With: Family Available Help at Discharge: Family;Available 24 hours/day Type of Home: Apartment Home Access: Stairs to enter Entrance Stairs-Number of Steps: 8 Entrance Stairs-Rails: Right;Left;Can reach both Bathroom Shower/Tub: Tub/shower unit;[employer] Firef[employer]: Standard Home Adaptive Equipment: None Prior Function Level of  Independence: Independent Able to Take Stairs?: Yes Driving: No Vocation: Unemployed Communication Communication: No difficulties Dominant  Hand: Right    Cognition  Cognition Overall Cognitive Status: Impaired Area of Impairment: Memory (Decreased verbal initiation, flat affect) Arousal/Alertness: Awake/alert Orientation Level: Appears intact for tasks assessed;Oriented X4 / Intact Behavior During Session: Flat affect Memory Deficits: difficulty remembering room number; difficulty remember genders of grandchildren initially Cognition - Other Comments: Noticible personality differences per family    Extremity/Trunk Assessment Right Upper Extremity Assessment RUE ROM/Strength/Tone: Deficits RUE ROM/Strength/Tone Deficits: modest weakness (carries RUE in flexion position at rest) RUE Sensation: WFL - Light Touch RUE Coordination: Deficits RUE Coordination Deficits: difficulty with rapid alternating movements Left Upper Extremity Assessment LUE ROM/Strength/Tone: WFL for tasks assessed LUE Sensation: WFL - Light Touch;WFL - Proprioception LUE Coordination: WFL - gross/fine motor Right Lower Extremity Assessment RLE ROM/Strength/Tone: WFL for tasks assessed RLE Sensation: WFL - Light Touch;WFL - Proprioception RLE Coordination: WFL - gross motor Left Lower Extremity Assessment LLE ROM/Strength/Tone: WFL for tasks assessed LLE Sensation: WFL - Light Touch;WFL - Proprioception LLE Coordination: WFL - gross motor   Balance Balance Balance Assessed: Yes Standardized Balance Assessment Standardized Balance Assessment: Dynamic Gait Index Dynamic Gait Index Level Surface: Normal Change in Gait Speed: Mild Impairment Gait with Horizontal Head Turns: Mild Impairment Gait with Vertical Head Turns: Mild Impairment Gait and Pivot Turn: Normal Step Over Obstacle: Normal Step Around Obstacles: Mild Impairment Steps: Mild Impairment Total Score: 19   End of Session PT - End of  Session Equipment Utilized During Treatment: Gait belt Activity Tolerance: Patient tolerated treatment well Patient left: in bed;with call bell/phone within reach;with family/visitor present Nurse Communication: Mobility status  GP     Fabio Asa 09/21/2012, 9:33 AM Charlotte Crumb, PT DPT  [phone]

## 2012-09-21 NOTE — Progress Notes (Signed)
Carotid Duplex (Doppler) has been completed.  Study was technically limited due to poor visualization of anatomy. There is no obvious evidence of hemodynamically significant internal carotid artery stenosis >40%. The right vertebral demonstrates an atypical antegrade waveform with loss of diastolic flow. The left vertebral artery is patent with antegrade flow.  09/21/2012 11:54 AM Gertie Fey, RDMS, RDCS

## 2012-09-21 NOTE — Evaluation (Signed)
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  Pt presents with a flat affect, decreased ability to multitask, decreased awareness of deficits, R side incoordination, and word finding deficits.  Will follow acutely.  Educated family on need for 24 hour supervision initially for safety.      OT Assessment  Patient needs continued OT Services    Follow Up Recommendations  Home health OT;Supervision/Assistance - 24 hour    Barriers to Discharge      Equipment Recommendations       Recommendations for Other Services    Frequency  Min 3X/week    Precautions / Restrictions Precautions Precautions: Fall Restrictions Weight Bearing Restrictions: No   Pertinent Vitals/Pain No pain    ADL  Eating/Feeding: Independent Where Assessed - Eating/Feeding: Bed level Grooming: Minimal assistance Where Assessed - Grooming: Unsupported standing Upper Body Bathing: Minimal assistance Where Assessed - Upper Body Bathing: Unsupported sitting Lower Body Bathing: Minimal assistance Where Assessed - Lower Body Bathing: Unsupported sitting;Supported sit to stand Upper Body Dressing: Minimal assistance Where Assessed - Upper Body Dressing: Unsupported sitting Lower Body Dressing: Minimal assistance Where Assessed - Lower Body Dressing: Unsupported sitting;Supported sit to stand Equipment Used: Gait belt Transfers/Ambulation Related to ADLs: min guard assist without device, postures R UE when ambulating, cannot talk and walk simultaneously. ADL Comments: Able to access feet for donning socks.    OT Diagnosis: Hemiplegia dominant side;Cognitive deficits  OT Problem List: Decreased strength;Decreased activity tolerance;Impaired balance  (sitting and/or standing);Decreased coordination;Decreased cognition;Decreased safety awareness;Obesity;Impaired UE functional use OT Treatment Interventions: Self-care/ADL training;Therapeutic activities;Cognitive remediation/compensation;Patient/family education   OT Goals Acute Rehab OT Goals OT Goal Formulation: With patient Time For Goal Achievement: 10/05/12 Potential to Achieve Goals: Good ADL Goals Pt Will Perform Grooming: with supervision;Standing at sink ADL Goal: Grooming - Progress: Goal set today Pt Will Perform Upper Body Bathing: with supervision;Standing at sink ADL Goal: Upper Body Bathing - Progress: Goal set today Pt Will Perform Lower Body Bathing: with supervision;Standing at sink ADL Goal: Lower Body Bathing - Progress: Goal set today Pt Will Perform Upper Body Dressing: with set-up;Sitting, bed ADL Goal: Upper Body Dressing - Progress: Goal set today Pt Will Perform Lower Body Dressing: with set-up;Sit to stand from bed ADL Goal: Lower Body Dressing - Progress: Goal set today Pt Will Transfer to Toilet: with supervision;Ambulation;Comfort height toilet ADL Goal: Toilet Transfer - Progress: Goal set today Pt Will Perform Tub/Shower Transfer: Tub transfer;with supervision;Ambulation ADL Goal: Tub/Shower Transfer - Progress: Goal set today Miscellaneous OT Goals Miscellaneous OT Goal #1: Pt/daughter will be independent in R UE home program to increase coordination. OT Goal: Miscellaneous Goal #1 - Progress: Goal set today Miscellaneous OT Goal #2: Daughter will be aware of safety concerns related to area effected by CVA. OT Goal: Miscellaneous Goal #2 - Progress: Goal set today  Visit Information  Last OT Received On: 09/21/12 Assistance Needed: +1 PT/OT Co-Evaluation/Treatment: Yes    Subjective Data  Subjective: "I have 6 grandchildren." Patient Stated Goal: Return home with family.   Prior Functioning     Home Living Lives With: Family (2 daughters  and 6 grandchildren) Available Help at Discharge: Family;Available 24 hours/day Type of Home: Apartment Home Access: Stairs to enter Entrance Stairs-Number of Steps: 8 Entrance Stairs-Rails: Right;Left;Can  reach both Bathroom Shower/Tub: Tub/shower unit;Curtain Dentist: None Prior Function Level of Independence: Independent Able to Take Stairs?: Yes Driving: No Vocation: Unemployed Communication Communication: No difficulties Dominant Hand: Right         Vision/Perception Vision - History Baseline Vision: No visual deficits Patient Visual Report: No change from baseline   Cognition  Cognition Overall Cognitive Status: Impaired Area of Impairment: Memory;Awareness of deficits (Decreased verbal initiation, flat affect) Arousal/Alertness: Awake/alert Orientation Level: Appears intact for tasks assessed;Oriented X4 / Intact Behavior During Session: Flat affect Memory Deficits: difficulty remembering room number; difficulty remember genders of grandchildren initially Cognition - Other Comments: Noticible personality differences per family    Extremity/Trunk Assessment Right Upper Extremity Assessment RUE ROM/Strength/Tone: Deficits RUE ROM/Strength/Tone Deficits: 4/5 (carries RUE in flexion position at rest) RUE Sensation: Deficits RUE Sensation Deficits: decreased finger identification RUE Coordination: Deficits RUE Coordination Deficits: difficulty with rapid alternating movements Left Upper Extremity Assessment LUE ROM/Strength/Tone: WFL for tasks assessed LUE Sensation: WFL - Light Touch;WFL - Proprioception LUE Coordination: WFL - gross/fine motor      Mobility Bed Mobility Bed Mobility: Supine to Sit;Sitting - Scoot to Edge of Bed;Sit to Supine Supine to Sit: 5: Supervision;HOB elevated Sitting - Scoot to Edge of Bed: 5: Supervision Sit to Supine: 5: Supervision;HOB elevated Transfers Sit to Stand: 5: Supervision;From  bed Stand to Sit: 5: Supervision;To bed     Exercise     Balance Balance Balance Assessed: Yes Standardized Balance Assessment Standardized Balance Assessment: Dynamic Gait Index     End of Session OT - End of Session Activity Tolerance: Patient limited by fatigue Patient left: in bed;with call bell/phone within reach;with bed alarm set;with family/visitor present  GO     Evern Bio 09/21/2012, 10:59 AM [phone]

## 2012-09-21 NOTE — Progress Notes (Signed)
Interval history:  52 yr old female admitted with left frontal lobe infarct  Assessment/Plan:  Acute infarct left frontal lobe Carotid doppler showed no significant stenosis,  Neuro recommending cerebral angio as the infarct is felt to be secondary to stenosis of the left supraclinoid ICA Cerebral angiogram in am Will change the aspirin 325 mg to aspirin 81 mg po daily and Plavix 75 mg po daily  Diabetes mellitus Hb a1c is 7.3 Blood glucose is stable Continue to hold glipizide and metformin Continue SSI  Hypertension BP is stable Continue with Lisinopril  Hyperlipidemia Continue zocor  DVT prophylaxis Lovenox  Family communication: discussed with family at bedside   Disposition: Pending cerebral angiogram and other stroke work up  Subjective: Patient seen and examined, admitted with right   facial droop and MRI brain confirmed left frontal lobe infarct.  Objective: Vital signs in last 24 hours: Temp:  [97.6 F (36.4 C)-98.6 F (37 C)] 98 F (36.7 C) (02/04 0525) Pulse Rate:  [77-94] 77  (02/04 0525) Resp:  [20] 20  (02/04 0525) BP: (94-132)/(50-69) 115/67 mmHg (02/04 0525) SpO2:  [98 %-100 %] 99 % (02/04 0525) Weight change:     Consults: Neurology Antibiotics  None Procedures: cardotid duplex 2 d echocardiogram  Intake/Output from previous day: 02/03 0701 - 02/04 0700 In: 560 [P.O.:240; I.V.:320] Out: -  Total I/O In: 480 [P.O.:480] Out: -    Physical Exam: Head: Normocephalic, atraumatic.  Eyes: No signs of jaundice, EOMI Nose: Mucous membranes dry.  Neck: supple,No deformities, masses, or tenderness noted. Lungs: Normal respiratory effort. B/L Clear to auscultation, no crackles or wheezes.  Heart: Regular RR. S1 and S2 normal  Abdomen: BS normoactive. Soft, Nondistended, non-tender.  Extremities: No pretibial edema, no erythema Neuro: AO x 3, right facial weakness, motor 5/5 in all extremities  Lab Results: Basic Metabolic  Panel:  Basename 09/20/12 1810 09/20/12 1247  NA -- 138  K -- 3.9  CL -- 101  CO2 -- 25  GLUCOSE -- 120*  BUN -- 19  CREATININE 0.66 0.77  CALCIUM -- 9.5  MG -- --  PHOS -- --   Liver Function Tests:  [facility] 09/20/12 1247  AST 14  ALT 14  ALKPHOS 68  BILITOT 0.4  PROT 7.4  ALBUMIN 3.9   No results found for this basename: LIPASE:2,AMYLASE:2 in the last 72 hours  Basename 09/20/12 1313  AMMONIA 36   CBC:  Basename 09/20/12 1810 09/20/12 1247  WBC 5.4 5.0  NEUTROABS -- 2.9  HGB 11.7* 11.2*  HCT 34.0* 32.3*  MCV 83.7 84.3  PLT 209 196   CBG:  Basename 09/21/12 1212 09/21/12 0646 09/20/12 2125 09/20/12 1653 09/20/12 1214  GLUCAP 136* 164* 205* 112* 126*   Hemoglobin A1C:  Basename 09/21/12 0650  HGBA1C 7.3*   Fasting Lipid Panel:  Basename 09/21/12 0650  CHOL 182  HDL 45  LDLCALC 96  TRIG 206*  CHOLHDL 4.0  LDLDIRECT --   Thyroid Function Tests:  Basename 09/20/12 1810  TSH 1.206  T4TOTAL --  FREET4 --  T3FREE --  THYROIDAB --   Anemia Panel: No results found for this basename: VITAMINB12,FOLATE,FERRITIN,TIBC,IRON,RETICCTPCT in the last 72 hours Coagulation:  Basename 09/20/12 1247  LABPROT 12.0  INR 0.89   Urine Drug Screen: Drugs of Abuse  No results found for this basename: labopia, cocainscrnur, labbenz, amphetmu, thcu, labbarb    Alcohol Level:  Basename 09/20/12 1314  ETH <11   Urinalysis: No results found for this basename: COLORURINE:2,APPERANCEUR:2,LABSPEC:2,PHURINE:2,GLUCOSEU:2,HGBUR:2,BILIRUBINUR:2,KETONESUR:2,PROTEINUR:2,UROBILINOGEN:2,NITRITE:2,LEUKOCYTESUR:2 in  the last 72 hours Misc. Labs:  No results found for this or any previous visit (from the past 240 hour(s)).  Studies/Results: Dg Chest 2 View  09/21/2012  *RADIOLOGY REPORT*  Clinical Data: Stroke protocol.  CHEST - 2 VIEW  Comparison: None.  Findings: The heart size and pulmonary vascularity are normal. The lungs appear clear and expanded without focal air  space disease or consolidation. No blunting of the costophrenic angles.  No pneumothorax.  Mediastinal contours appear intact.  Degenerative changes in the spine.  The  IMPRESSION: No evidence of active pulmonary disease.   Original Report Authenticated By: Burman Nieves, M.D.    Ct Head Wo Contrast  09/20/2012  *RADIOLOGY REPORT*  Clinical Data: Right facial droop.  CT HEAD WITHOUT CONTRAST  Technique:  Contiguous axial images were obtained from the base of the skull through the vertex without contrast.  Comparison: None.  Findings: There is focal area of mildly decreased attenuation in the left frontal deep white matter, extending to the cortex (example image 11).  Mild asymmetric edema is seen in the high left frontal lobe, including the cortex, with extension to the left parietal lobe.  No associated acute hemorrhage.  No evidence of mass lesion, mass effect or hydrocephalus.  Visualized portions of the paranasal sinuses and mastoid air cells are clear.  IMPRESSION:  Low attenuation involving primarily the left frontal lobe, with some extension to the left parietal lobe, worrisome for acute or subacute infarction.  MR brain without and with contrast, including MR angiography, recommended. Critical Value/emergent results were called by telephone at the time of interpretation on 09/20/2012 at 1255 hours to Dr. Azalia Bilis, who verbally acknowledged these results.   Original Report Authenticated By: Leanna Battles, M.D.    Mr [facility] Wo Contrast  09/20/2012  *RADIOLOGY REPORT*  Clinical Data:  Stroke.  Hypertension, diabetes, hyperlipidemia. Right facial droop.  MRI HEAD WITHOUT CONTRAST MRA HEAD WITHOUT CONTRAST  Technique:  Multiplanar, multiecho pulse sequences of the brain and surrounding structures were obtained without intravenous contrast. Angiographic images of the head were obtained using MRA technique without contrast.  Comparison:  CT 09/20/2012  MRI HEAD  Findings:  Acute infarct left frontal  white matter and overlying cortex.  This extends back to the parietal lobe.  There is also a chronic infarct in the left parietal lobe with cortical volume loss.  Minimal chronic hemorrhage is present in the chronic left parietal infarct.  No other acute infarct.  Ventricle size is normal.  No mass lesion.  Brainstem and cerebellum are intact.  Negative for Chiari malformation. Pituitary is normal in size.  Paranasal sinuses are clear.  IMPRESSION: Acute infarct left frontal lobe.  Chronic infarct left parietal lobe.  MRA HEAD  Findings: Both vertebral arteries are patent to the basilar.  The basilar is widely patent.  Posterior cerebral arteries are patent bilaterally.  Left posterior communicating artery is patent.  The right posterior communicating artery is not patent.  Severe stenosis of the distal left internal carotid artery with decreased flow in the left A1 and left M1 segments.  There is poor visualization of left middle cerebral artery branches which may be due to chronic occlusion or slow flow.  The anterior communicating artery is patent and supplies the left anterior cerebral artery which is patent.  Mild stenosis of the right supraclinoid internal carotid artery. Right anterior and middle cerebral arteries are patent.  Negative for cerebral aneurysm.  IMPRESSION: Severe stenosis of the supraclinoid internal carotid  artery on the left with decreased flow in the left M1 and A1 segments.   Original Report Authenticated By: Janeece Riggers, M.D.    Mr Brain Wo Contrast  09/20/2012  *RADIOLOGY REPORT*  Clinical Data:  Stroke.  Hypertension, diabetes, hyperlipidemia. Right facial droop.  MRI HEAD WITHOUT CONTRAST MRA HEAD WITHOUT CONTRAST  Technique:  Multiplanar, multiecho pulse sequences of the brain and surrounding structures were obtained without intravenous contrast. Angiographic images of the head were obtained using MRA technique without contrast.  Comparison:  CT 09/20/2012  MRI HEAD  Findings:  Acute  infarct left frontal white matter and overlying cortex.  This extends back to the parietal lobe.  There is also a chronic infarct in the left parietal lobe with cortical volume loss.  Minimal chronic hemorrhage is present in the chronic left parietal infarct.  No other acute infarct.  Ventricle size is normal.  No mass lesion.  Brainstem and cerebellum are intact.  Negative for Chiari malformation. Pituitary is normal in size.  Paranasal sinuses are clear.  IMPRESSION: Acute infarct left frontal lobe.  Chronic infarct left parietal lobe.  MRA HEAD  Findings: Both vertebral arteries are patent to the basilar.  The basilar is widely patent.  Posterior cerebral arteries are patent bilaterally.  Left posterior communicating artery is patent.  The right posterior communicating artery is not patent.  Severe stenosis of the distal left internal carotid artery with decreased flow in the left A1 and left M1 segments.  There is poor visualization of left middle cerebral artery branches which may be due to chronic occlusion or slow flow.  The anterior communicating artery is patent and supplies the left anterior cerebral artery which is patent.  Mild stenosis of the right supraclinoid internal carotid artery. Right anterior and middle cerebral arteries are patent.  Negative for cerebral aneurysm.  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   Original Report Authenticated By: Janeece Riggers, M.D.     Medications: Scheduled Meds:   . aspirin  300 mg Rectal Daily   Or  . aspirin  325 mg Oral Daily  . enoxaparin (LOVENOX) injection  40 mg Subcutaneous Q24H  . gabapentin  100 mg Oral TID  . insulin aspart  0-5 Units Subcutaneous QHS  . insulin aspart  0-9 Units Subcutaneous TID WC  . lisinopril  10 mg Oral Daily  . simvastatin  20 mg Oral q1800   Continuous Infusions:  PRN Meds:.acetaminophen, acetaminophen, ondansetron  (ZOFRAN) IV    Principal Problem:  *Facial droop Active Problems:  HTN (hypertension)  Diabetes  HLD (hyperlipidemia)  H/O: stroke with residual effects        LOS: 1 day    [facility] Pager: [phone] 09/21/2012, 3:53 PM

## 2012-09-22 ENCOUNTER — Inpatient Hospital Stay (HOSPITAL_COMMUNITY): Payer: Medicaid Other

## 2012-09-22 LAB — PROTIME-INR
INR: 0.92 (ref 0.00–1.49)
Prothrombin Time: 12.3 seconds (ref 11.6–15.2)

## 2012-09-22 MED ORDER — GLIPIZIDE ER 5 MG PO TB24: 5.0000 mg | ORAL_TABLET | Freq: Every day | ORAL | Status: DC

## 2012-09-22 MED ORDER — GABAPENTIN 100 MG PO CAPS: 100.0000 mg | ORAL_CAPSULE | Freq: Three times a day (TID) | ORAL | Status: DC

## 2012-09-22 MED ORDER — ASPIRIN 81 MG PO CHEW
CHEWABLE_TABLET | ORAL | Status: AC
  Administered 2012-09-22: 81 mg
  Filled 2012-09-22: qty 1

## 2012-09-22 MED ORDER — HEPARIN SOD (PORK) LOCK FLUSH 100 UNIT/ML IV SOLN
INTRAVENOUS | Status: AC | PRN
  Administered 2012-09-22: 500 [IU] via INTRAVENOUS

## 2012-09-22 MED ORDER — CLOPIDOGREL BISULFATE 75 MG PO TABS
ORAL_TABLET | ORAL | Status: AC
  Filled 2012-09-22: qty 1

## 2012-09-22 MED ORDER — MIDAZOLAM HCL 2 MG/2ML IJ SOLN
INTRAMUSCULAR | Status: AC
  Filled 2012-09-22: qty 4

## 2012-09-22 MED ORDER — MIDAZOLAM HCL 2 MG/2ML IJ SOLN
INTRAMUSCULAR | Status: AC | PRN
  Administered 2012-09-22: 1 mg via INTRAVENOUS

## 2012-09-22 MED ORDER — PRAVASTATIN SODIUM 40 MG PO TABS: 40.0000 mg | ORAL_TABLET | Freq: Every day | ORAL | Status: DC

## 2012-09-22 MED ORDER — CLOPIDOGREL BISULFATE 75 MG PO TABS: 75.0000 mg | ORAL_TABLET | Freq: Every day | ORAL | Status: DC

## 2012-09-22 MED ORDER — FENTANYL CITRATE 0.05 MG/ML IJ SOLN
INTRAMUSCULAR | Status: AC
  Filled 2012-09-22: qty 4

## 2012-09-22 MED ORDER — LISINOPRIL 10 MG PO TABS: 10.0000 mg | ORAL_TABLET | Freq: Every day | ORAL | Status: DC

## 2012-09-22 MED ORDER — IOHEXOL 300 MG/ML  SOLN
150.0000 mL | Freq: Once | INTRAMUSCULAR | Status: AC | PRN
  Administered 2012-09-22: 80 mL via INTRA_ARTERIAL

## 2012-09-22 MED ORDER — FENTANYL CITRATE 0.05 MG/ML IJ SOLN
INTRAMUSCULAR | Status: AC | PRN
  Administered 2012-09-22: 25 ug via INTRAVENOUS

## 2012-09-22 MED ORDER — METFORMIN HCL 1000 MG PO TABS: 1000.0000 mg | ORAL_TABLET | Freq: Two times a day (BID) | ORAL | Status: DC

## 2012-09-22 MED ORDER — HYDRALAZINE HCL 20 MG/ML IJ SOLN
INTRAMUSCULAR | Status: AC
  Filled 2012-09-22: qty 1

## 2012-09-22 NOTE — Progress Notes (Signed)
PT Cancellation Note  Patient Details Name: Felicia Chavez MRN: [id number] DOB: 1961-04-29   Cancelled Treatment:    Reason Eval/Treat Not Completed: Patient at procedure or test/unavailable; Pt at angiogram study, will try back if time permits.   Fabio Asa 09/22/2012, 8:28 AM

## 2012-09-22 NOTE — Progress Notes (Signed)
[facility] PROGRESS NOTE  Jennavie Martinek RUE:[id number] DOB: Feb 11, 1961 DOA: 09/20/2012 PCP: No primary provider on file.  Assessment/Plan: Acute infarct left frontal lobe  Carotid dopplers and 2D echo done with results as indicated below. Cerebral angio done, patient to followup with Dr. Corliss Skains on 09/24/2012 for further management. Continue aspirin 81 mg po daily and Plavix 75 mg po daily. Neurology evaluated the patient during the hospital stay. Arrange for [facility] PT/OT at discharge.  Diabetes mellitus  Hb a1c is 7.3. Blood glucose is stable. Resume glipizide and metformin at discharge. Continue SSI   Hypertension  BP is stable. Continue with Lisinopril.  Hyperlipidemia  Continue statin, LDL 96.  DVT prophylaxis  Lovenox  Code Status: Full code. Family Communication: Family at bedside. Disposition Plan: DC today.   Consultants:  Neurology  Procedures: 2D Echo on 09/21/2012 Study Conclusions - Left ventricle: The cavity size was normal. There was moderate concentric hypertrophy. Systolic function was normal. The estimated ejection fraction was in the range of 55% to 60%. Wall motion was normal; there were no regional wall motion abnormalities. Doppler parameters are consistent with abnormal left ventricular relaxation (grade 1 diastolic dysfunction). - Mitral valve: Calcified annulus. Trivial regurgitation. - Left atrium: The atrium was normal in size. - Inferior vena cava: The vessel was normal in size; the respirophasic diameter changes were in the normal range (= 50%); findings are consistent with normal central venous pressure.  Carotid dopplers on 09/21/2012 Summary: Study was technically difficult due to poor visualization of anatomy. There is no obvious evidence of significant extracranial carotid artery stenosis >40% demonstrated. The right vertebral demonstrates an atypical antegrade waveform with loss of diastolic flow suggestive of distal occlusion.The left vertebral  artery is patent with antegrade flow.  S/P 4 vessel cerebral arteriogram on 09/22/2012 Pre occlusive Lt ICA supraclinoid stenosis, Occluded Lt ACA prox, 50- 55 % stenosis of Rt ICA prox.  Antibiotics:  None.  HPI/Subjective: No specific complaints. Denies any pain.  Objective: Filed Vitals:   09/22/12 0905 09/22/12 0910 09/22/12 0925 09/22/12 1000  BP: 155/84 145/75 158/74 113/69  Pulse: 76 72 74 75  Temp:    98 F (36.7 C)  TempSrc:    Oral  Resp: 13 17 14 16   Height:      Weight:      SpO2: 100% 100% 98% 99%    Intake/Output Summary (Last 24 hours) at 09/22/12 1353 Last data filed at 09/22/12 1300  Gross per 24 hour  Intake    480 ml  Output      0 ml  Net    480 ml   Filed Weights   09/21/12 1800  Weight: 82.827 kg (182 lb 9.6 oz)    Exam: Physical Exam: General: Awake, Oriented, No acute distress. HEENT: EOMI. Neck: Supple CV: S1 and S2 Lungs: Clear to ascultation bilaterally Abdomen: Soft, Nontender, Nondistended, +bowel sounds. Ext: Good pulses. Trace edema.  Data Reviewed: Basic Metabolic Panel:  Lab 09/20/12 1478 09/20/12 1247  NA -- 138  K -- 3.9  CL -- 101  CO2 -- 25  GLUCOSE -- 120*  BUN -- 19  CREATININE 0.66 0.77  CALCIUM -- 9.5  MG -- --  PHOS -- --   Liver Function Tests:  Lab 09/20/12 1247  AST 14  ALT 14  ALKPHOS 68  BILITOT 0.4  PROT 7.4  ALBUMIN 3.9   No results found for this basename: LIPASE:5,AMYLASE:5 in the last 168 hours  Lab 09/20/12 1313  AMMONIA 36  CBC:  Lab 09/20/12 1810 09/20/12 1247  WBC 5.4 5.0  NEUTROABS -- 2.9  HGB 11.7* 11.2*  HCT 34.0* 32.3*  MCV 83.7 84.3  PLT 209 196   Cardiac Enzymes: No results found for this basename: CKTOTAL:5,CKMB:5,CKMBINDEX:5,TROPONINI:5 in the last 168 hours BNP (last 3 results) No results found for this basename: PROBNP:3 in the last 8760 hours CBG:  Lab 09/22/12 1135 09/22/12 0642 09/21/12 2150 09/21/12 1640 09/21/12 1212  GLUCAP 144* 156* 160* 203* 136*     No results found for this or any previous visit (from the past 240 hour(s)).   Studies: Dg Chest 2 View  09/21/2012  *RADIOLOGY REPORT*  Clinical Data: Stroke protocol.  CHEST - 2 VIEW  Comparison: None.  Findings: The heart size and pulmonary vascularity are normal. The lungs appear clear and expanded without focal air space disease or consolidation. No blunting of the costophrenic angles.  No pneumothorax.  Mediastinal contours appear intact.  Degenerative changes in the spine.  The  IMPRESSION: No evidence of active pulmonary disease.   Original Report Authenticated By: Burman Nieves, M.D.    Mr [facility] Wo Contrast  09/20/2012  *RADIOLOGY REPORT*  Clinical Data:  Stroke.  Hypertension, diabetes, hyperlipidemia. Right facial droop.  MRI HEAD WITHOUT CONTRAST MRA HEAD WITHOUT CONTRAST  Technique:  Multiplanar, multiecho pulse sequences of the brain and surrounding structures were obtained without intravenous contrast. Angiographic images of the head were obtained using MRA technique without contrast.  Comparison:  CT 09/20/2012  MRI HEAD  Findings:  Acute infarct left frontal white matter and overlying cortex.  This extends back to the parietal lobe.  There is also a chronic infarct in the left parietal lobe with cortical volume loss.  Minimal chronic hemorrhage is present in the chronic left parietal infarct.  No other acute infarct.  Ventricle size is normal.  No mass lesion.  Brainstem and cerebellum are intact.  Negative for Chiari malformation. Pituitary is normal in size.  Paranasal sinuses are clear.  IMPRESSION: Acute infarct left frontal lobe.  Chronic infarct left parietal lobe.  MRA HEAD  Findings: Both vertebral arteries are patent to the basilar.  The basilar is widely patent.  Posterior cerebral arteries are patent bilaterally.  Left posterior communicating artery is patent.  The right posterior communicating artery is not patent.  Severe stenosis of the distal left internal carotid  artery with decreased flow in the left A1 and left M1 segments.  There is poor visualization of left middle cerebral artery branches which may be due to chronic occlusion or slow flow.  The anterior communicating artery is patent and supplies the left anterior cerebral artery which is patent.  Mild stenosis of the right supraclinoid internal carotid artery. Right anterior and middle cerebral arteries are patent.  Negative for cerebral aneurysm.  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   Original Report Authenticated By: Janeece Riggers, M.D.    Mr Brain Wo Contrast  09/20/2012  *RADIOLOGY REPORT*  Clinical Data:  Stroke.  Hypertension, diabetes, hyperlipidemia. Right facial droop.  MRI HEAD WITHOUT CONTRAST MRA HEAD WITHOUT CONTRAST  Technique:  Multiplanar, multiecho pulse sequences of the brain and surrounding structures were obtained without intravenous contrast. Angiographic images of the head were obtained using MRA technique without contrast.  Comparison:  CT 09/20/2012  MRI HEAD  Findings:  Acute infarct left frontal white matter and overlying cortex.  This extends back  to the parietal lobe.  There is also a chronic infarct in the left parietal lobe with cortical volume loss.  Minimal chronic hemorrhage is present in the chronic left parietal infarct.  No other acute infarct.  Ventricle size is normal.  No mass lesion.  Brainstem and cerebellum are intact.  Negative for Chiari malformation. Pituitary is normal in size.  Paranasal sinuses are clear.  IMPRESSION: Acute infarct left frontal lobe.  Chronic infarct left parietal lobe.  MRA HEAD  Findings: Both vertebral arteries are patent to the basilar.  The basilar is widely patent.  Posterior cerebral arteries are patent bilaterally.  Left posterior communicating artery is patent.  The right posterior communicating artery is not patent.  Severe stenosis of the distal  left internal carotid artery with decreased flow in the left A1 and left M1 segments.  There is poor visualization of left middle cerebral artery branches which may be due to chronic occlusion or slow flow.  The anterior communicating artery is patent and supplies the left anterior cerebral artery which is patent.  Mild stenosis of the right supraclinoid internal carotid artery. Right anterior and middle cerebral arteries are patent.  Negative for cerebral aneurysm.  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   Original Report Authenticated By: Janeece Riggers, M.D.     Scheduled Meds:   . aspirin EC  81 mg Oral Daily  . clopidogrel      . clopidogrel  75 mg Oral Q breakfast  . enoxaparin (LOVENOX) injection  40 mg Subcutaneous Q24H  . fentaNYL      . gabapentin  100 mg Oral TID  . insulin aspart  0-5 Units Subcutaneous QHS  . insulin aspart  0-9 Units Subcutaneous TID WC  . lisinopril  10 mg Oral Daily  . midazolam      . simvastatin  20 mg Oral q1800   Continuous Infusions:   Principal Problem:  *Facial droop Active Problems:  HTN (hypertension)  Diabetes  HLD (hyperlipidemia)  H/O: stroke with residual effects    Calvyn Kurtzman A, MD  [facility] Pager [phone]. If 7PM-7AM, please contact night-coverage at [email or web address], password [facility] 09/22/2012, 1:53 PM  LOS: 2 days

## 2012-09-22 NOTE — Evaluation (Signed)
Speech Language Pathology Evaluation Patient Details Name: Felicia Chavez MRN: [id number] DOB: 1961-04-17 Today's Date: 09/22/2012 Time: 2841-3244 SLP Time Calculation (min): 28 min  Problem List:  Patient Active Problem List  Diagnosis  . HTN (hypertension)  . Diabetes  . HLD (hyperlipidemia)  . H/O: stroke with residual effects  . Facial droop   Past Medical History:  Past Medical History  Diagnosis Date  . Hypertension   . Diabetes mellitus without complication    Past Surgical History: History reviewed. No pertinent past surgical history. HPI:  Felicia Chavez is an 52 y.o. 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     Assessment / Plan / Recommendation Clinical Impression  Patient presents with mild pragmatic differences, including decreased eye contact, decreased initiation of speech and poor maintenence of conversation.  Patient answers questions appropriately, and expresses her basic needs without difficulty.  It appears that she is at a functional communication level  for her needs at home with family and friends.    SLP Assessment  Patient does not need any further Speech Lanaguage Pathology Services    Follow Up Recommendations  None    Frequency and Duration        Pertinent Vitals/Pain N/A   SLP Goals     SLP Evaluation Prior Functioning  Cognitive/Linguistic Baseline: Within functional limits Type of Home: Apartment Lives With: Family Available Help at Discharge: Family;Available 24 hours/day Vocation: Unemployed   Cognition  Overall Cognitive Status: Appears within functional limits for tasks assessed Arousal/Alertness:  Awake/alert Orientation Level: Oriented X4 Attention: Focused Focused Attention: Appears intact Memory: Appears intact Awareness: Appears intact Problem Solving: Appears intact Safety/Judgment: Appears intact    Comprehension  Auditory Comprehension Overall Auditory Comprehension: Appears within functional limits for tasks assessed Yes/No Questions: Within Functional Limits Commands: Within Functional Limits Conversation: Simple EffectiveTechniques: Extra processing time Visual Recognition/Discrimination Discrimination: Not tested Reading Comprehension Reading Status: Not tested    Expression Expression Primary Mode of Expression: Verbal Verbal Expression Overall Verbal Expression: Appears within functional limits for tasks assessed Initiation: Impaired Level of Generative/Spontaneous Verbalization: Sentence Repetition: No impairment Naming: No impairment Pragmatics: Impairment Impairments: Abnormal affect;Eye contact;Monotone;Topic maintenance Effective Techniques: Open ended questions Written Expression Dominant Hand: Right Written Expression: Not tested   Oral / Motor Oral Motor/Sensory Function Overall Oral Motor/Sensory Function: Impaired Labial Symmetry: Abnormal symmetry right Labial Strength: Reduced Lingual ROM: Within Functional Limits Lingual Symmetry: Within Functional Limits Facial ROM: Reduced right Facial Symmetry: Right droop Facial Strength: Reduced Mandible: Within Functional Limits Motor Speech Overall Motor Speech: Appears within functional limits for tasks assessed Respiration: Within functional limits Phonation: Normal Articulation: Within functional limitis Intelligibility: Intelligible Motor Planning: Witnin functional limits Motor Speech Errors: Not applicable   GO     Maryjo Rochester T 09/22/2012, 2:10 PM

## 2012-09-22 NOTE — Progress Notes (Signed)
Stroke Team Progress Note  HISTORY Felicia Chavez is an 52 y.o. 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  She said that she didn't have a headache, vertigo, double vision, worsening speech, numbness, increasing right arm weakness or right leg weakness. She stopped taking her aspirin for an unknown period of time.   Upon arrival to the ED an urgent CT brain showed an area of low attenuation involving the right frontal area, and a subsequent MRI-DWI brain revealed an acute infarct left frontal white matter and overlying cortex that extends back to the parietal lobe. Brain MRA showed severe stenosis of the supraclinoid internal carotid artery on the left with decreased flow in the left M1 and A1 segments. No other complains today.  Patient was not a TPA candidate secondary to delay in arrival. She was admitted for further evaluation and treatment.  SUBJECTIVE Her family is at the bedside.  Overall she feels her condition is slowly improving. She still states that she has problems with her right hand and leg feeling weak.    OBJECTIVE Most recent Vital Signs: Filed Vitals:   09/21/12 1800 09/21/12 2148 09/22/12 0219 09/22/12 0640  BP: 109/45 112/60 109/62 128/79  Pulse: 81 80 82 74  Temp: 97.9 F (36.6 C) 98.1 F (36.7 C) 98.1 F (36.7 C) 98.1 F (36.7 C)  TempSrc: Oral Oral Oral Oral  Resp: 20 18 18 18   Height: 5\' 2"  (1.575 m)     Weight: 182 lb 9.6 oz (82.827 kg)     SpO2: 99% 99% 98% 99%   CBG (last 3)   Basename 09/22/12 0642 09/21/12 2150 09/21/12 1640  GLUCAP 156* 160* 203*    IV Fluid Intake:     MEDICATIONS     . aspirin EC  81 mg Oral Daily   . clopidogrel  75 mg Oral Q breakfast  . enoxaparin (LOVENOX) injection  40 mg Subcutaneous Q24H  . gabapentin  100 mg Oral TID  . insulin aspart  0-5 Units Subcutaneous QHS  . insulin aspart  0-9 Units Subcutaneous TID WC  . lisinopril  10 mg Oral Daily  . simvastatin  20 mg Oral q1800   PRN:  acetaminophen, acetaminophen, ondansetron (ZOFRAN) IV  Diet:  NPO thin liquids Activity:  Bedrest and OOB with assistance DVT Prophylaxis:  Lovenox 40 mg sq daily   CLINICALLY SIGNIFICANT STUDIES Basic Metabolic Panel:   Lab 09/20/12 1810 09/20/12 1247  NA -- 138  K -- 3.9  CL -- 101  CO2 -- 25  GLUCOSE -- 120*  BUN -- 19  CREATININE 0.66 0.77  CALCIUM -- 9.5  MG -- --  PHOS -- --   Liver Function Tests:   Lab 09/20/12 1247  AST 14  ALT 14  ALKPHOS 68  BILITOT 0.4  PROT 7.4  ALBUMIN 3.9   CBC:   Lab 09/20/12 1810 09/20/12 1247  WBC 5.4 5.0  NEUTROABS -- 2.9  HGB 11.7* 11.2*  HCT 34.0* 32.3*  MCV 83.7 84.3  PLT 209 196   Coagulation:   Lab 09/22/12 0530 09/20/12 1247  LABPROT 12.3 12.0  INR 0.92 0.89   Cardiac Enzymes: No results found for this basename:  CKTOTAL:3,CKMB:3,CKMBINDEX:3,TROPONINI:3 in the last 168 hours Urinalysis: No results found for this basename: COLORURINE:2,APPERANCEUR:2,LABSPEC:2,PHURINE:2,GLUCOSEU:2,HGBUR:2,BILIRUBINUR:2,KETONESUR:2,PROTEINUR:2,UROBILINOGEN:2,NITRITE:2,LEUKOCYTESUR:2 in the last 168 hours Lipid Panel    Component Value Date/Time   CHOL 182 09/21/2012 0650   TRIG 206* 09/21/2012 0650   HDL 45 09/21/2012 0650   CHOLHDL 4.0 09/21/2012 0650   VLDL 41* 09/21/2012 0650   LDLCALC 96 09/21/2012 0650   HgbA1C  Lab Results  Component Value Date   HGBA1C 7.3* 09/21/2012    Urine Drug Screen:   No results found for this basename: labopia,  cocainscrnur,  labbenz,  amphetmu,  thcu,  labbarb    Alcohol Level:   Lab 09/20/12 1314  ETH <11   CT of the brain  09/20/2012  Low attenuation involving primarily the left frontal lobe, with some  extension to the left parietal lobe, worrisome for acute or subacute infarction.  MR brain without and with contrast, including MR angiography, recommended.  MRI of the brain  09/20/2012   Acute infarct left frontal lobe.  Chronic infarct left parietal lobe.   MRA of the brain  09/20/2012 Severe stenosis of the supraclinoid internal carotid artery on the left with decreased flow in the left M1 and A1 segments.   2D Echocardiogram    Carotid Doppler  Study was technically limited due to poor visualization of anatomy. There is no obvious evidence of hemodynamically significant internal carotid artery stenosis >40%. The right vertebral demonstrates an atypical antegrade waveform with loss of diastolic flow. The left vertebral artery is patent with antegrade flow.   CXR  09/21/2012 No evidence of active pulmonary disease.     EKG  sinus tachycardia.   Therapy Recommendations no PT, home health OT and ST  Physical Exam   Middle aged african american lady not in distress.Awake alert. Afebrile.  Head is nontraumatic. Neck is supple without bruit. Hearing is normal.  Cardiac exam no murmur or gallop.  Lungs are clear to auscultation.  Distal pulses are well felt.  Neurological Exam : awake alert oriented x3. Nonfluent speech with mild expressive language difficulties but good comprehension naming and repetition. Left gaze preference but is able to look to the right past midline. Decreased blink to threat on the right. Mild right lower facial weakness. Tongue is midline. Mild right lower extremity drift. Mild right grip weakness and diminished fine finger movements on the right. Orbits left over right upper extremity. Diminished sensation on the right. Coordination is slightly impaired on the right. Gait was not tested.  ASSESSMENT Felicia Chavez is a 52 y.o. female presenting with left facial droop and language abnormality. Imaging confirms a left frontal lobe infarct. Infarct felt to be thrombotic  secondary to severe stenosis of the left supraclinoid internal carotid artery with decreased flow in the left M1 and A1.  On aspirin 81 mg orally every day prior to admission. Now on aspirin 325 mg orally every day for secondary stroke prevention. Patient with resultant expressive aphasia, neuro neglect. Work up underway with plans for the cerebral angiogram today.    Cigarette smoker, Stressed cessation Hyperlipidemia, LDL 96, on statin PTA, on statin now, goal LDL < 100 Diabetes, HgbA1c 7.1 Obesity, Body mass index is 33.40 kg/(m^2).  Hospital day # 2  TREATMENT/PLAN  Change to  aspirin 81 mg orally every day and clopidogrel 75 mg orally every day for secondary stroke prevention given her severe intracranial atherosclerosis for at least 3 months.  Daughter concerned with cost of Rx. Will get care manager consult to assist  with [email or web address] vs needymeds  Cerebral angio today to evaluate vasculature for possible intervention. Patient made NPO at midnight.   Leodis Sias, MD Internal Medicine Resident, PGY III Co-Chief Resident, Internal Medicine Pager: [phone] 09/22/2012 7:49 AM   I have personally obtained a history, examined the patient, evaluated imaging results, and formulated the assessment and plan of care. I agree with the above.  Delia Heady, MD Medical Director [facility] Pager: [phone] 09/22/2012 7:42 AM

## 2012-09-22 NOTE — Procedures (Signed)
S/P 4 vessel cerebral arteriogram. Rt CFA approach  Findings  1.pre occlusive Lt ICA supraclinoid stenosis . 2. Oclluded Lt ACA prox  3.50- 55 % stenosis of Rt ICA prox

## 2012-09-22 NOTE — Discharge Summary (Signed)
Physician Discharge Summary  Cadee Agro ZOX:[id number] DOB: 1961/03/25 DOA: 09/20/2012  PCP: No primary provider on file.  Admit date: 09/20/2012 Discharge date: 09/22/2012  Time spent: 25 minutes  Recommendations for Outpatient Follow-up:  Please followup with PCP in 1 week.  Please followup with Dr. Pearlean Brownie neurology in 2 months.  Please followup with Dr. Corliss Skains, IR, on 09/24/2012 as already scheduled.  Restart metformin on 09/24/2012 (received contrast on 09/22/2012)  Discharge Diagnoses:  Principal Problem:  *Facial droop Active Problems:  HTN (hypertension)  Diabetes  HLD (hyperlipidemia)  H/O: stroke with residual effects   Discharge Condition: Stable  Diet recommendation: Diabetic diet  Filed Weights   09/21/12 1800  Weight: 82.827 kg (182 lb 9.6 oz)    History of present illness:  On admission: "Felicia Chavez is a 52 y.o. female with past medical history significant for hypertension, diabetes, hyperlipidemia and preview stroke in June 2013 (with residual right upper extremity weakness); came to the hospital secondary to new onset of facial droop (right side). Family members reports that the patient was last seen normal are all 10 PM last night before she went to bed, this morning when she will call her daughter noticed that she was having right facial droop; patient was brought to the emergency department but because she was out of the therapeutic window no code stroke was activated."  Hospital Course:  Acute infarct left frontal lobe  Carotid dopplers and 2D echo done with results as indicated below. Cerebral angio done, patient to followup with Dr. Corliss Skains on 09/24/2012 for further management. Continue aspirin 81 mg po daily and Plavix 75 mg po daily. Neurology evaluated the patient during the hospital stay. Arrange for [facility] PT/OT at discharge.  Diabetes mellitus  Hb a1c is 7.3. Blood glucose is stable. Resume glipizide and metformin at discharge. Continue SSI    Hypertension  BP is stable. Continue with Lisinopril.  Hyperlipidemia  Continue statin, LDL 96.  DVT prophylaxis  Lovenox  Consultants:  Neurology  Procedures: 2D Echo on 09/21/2012 Study Conclusions - Left ventricle: The cavity size was normal. There was moderate concentric hypertrophy. Systolic function was normal. The estimated ejection fraction was in the range of 55% to 60%. Wall motion was normal; there were no regional wall motion abnormalities. Doppler parameters are consistent with abnormal left ventricular relaxation (grade 1 diastolic dysfunction). - Mitral valve: Calcified annulus. Trivial regurgitation. - Left atrium: The atrium was normal in size. - Inferior vena cava: The vessel was normal in size; the respirophasic diameter changes were in the normal range (= 50%); findings are consistent with normal central venous pressure.  Carotid dopplers on 09/21/2012 Summary: Study was technically difficult due to poor visualization of anatomy. There is no obvious evidence of significant extracranial carotid artery stenosis >40% demonstrated. The right vertebral demonstrates an atypical antegrade waveform with loss of diastolic flow suggestive of distal occlusion.The left vertebral artery is patent with antegrade flow.  S/P 4 vessel cerebral arteriogram on 09/22/2012 Pre occlusive Lt ICA supraclinoid stenosis, Occluded Lt ACA prox, 50- 55 % stenosis of Rt ICA prox.  Antibiotics:  None.  Discharge Exam: Filed Vitals:   09/22/12 0905 09/22/12 0910 09/22/12 0925 09/22/12 1000  BP: 155/84 145/75 158/74 113/69  Pulse: 76 72 74 75  Temp:    98 F (36.7 C)  TempSrc:    Oral  Resp: 13 17 14 16   Height:      Weight:      SpO2: 100% 100% 98% 99%   Discharge  Instructions  Discharge Orders    Future Appointments: Provider: Department: Dept Phone: Center:   09/24/2012 12:00 PM [facility] INTERVENTIONAL RADIOLOGY [phone] [facility]     Future Orders  Please Complete By Expires   Diet Carb Modified      Increase activity slowly      Discharge instructions      Comments:   Please followup with PCP in 1 week.  Please followup with Dr. Pearlean Brownie neurology in 2 months.  Please followup with Dr. Corliss Skains, IR, on 09/24/2012 as already scheduled.  Start metformin on 09/24/2012.       Medication List     As of 09/22/2012  2:05 PM    TAKE these medications         aspirin EC 81 MG tablet   Take 81 mg by mouth daily.      clopidogrel 75 MG tablet   Commonly known as: PLAVIX   Take 1 tablet (75 mg total) by mouth daily with breakfast.      gabapentin 100 MG capsule   Commonly known as: NEURONTIN   Take 1 capsule (100 mg total) by mouth 3 (three) times daily.      glipiZIDE 5 MG 24 hr tablet   Commonly known as: GLUCOTROL XL   Take 1 tablet (5 mg total) by mouth daily.      lisinopril 10 MG tablet   Commonly known as: PRINIVIL,ZESTRIL   Take 1 tablet (10 mg total) by mouth daily.      metFORMIN 1000 MG tablet   Commonly known as: GLUCOPHAGE   Take 1 tablet (1,000 mg total) by mouth 2 (two) times daily with a meal. Please start on 09/24/2012   Start taking on: 09/24/2012      pravastatin 40 MG tablet   Commonly known as: PRAVACHOL   Take 1 tablet (40 mg total) by mouth daily.           Follow-up Information    Follow up with PCP. Schedule an appointment as soon as possible for a visit in 1 week.      Follow up with Gates Rigg, MD. Schedule an appointment as soon as possible for a visit in 2 months.   Contact information:   [address], [address] [facility] [place] [address] [phone]           The results of significant diagnostics from this hospitalization (including imaging, microbiology, ancillary and laboratory) are listed below for reference.    Significant Diagnostic Studies: Dg Chest 2 View  09/21/2012  *RADIOLOGY REPORT*  Clinical Data: Stroke protocol.  CHEST - 2 VIEW   Comparison: None.  Findings: The heart size and pulmonary vascularity are normal. The lungs appear clear and expanded without focal air space disease or consolidation. No blunting of the costophrenic angles.  No pneumothorax.  Mediastinal contours appear intact.  Degenerative changes in the spine.  The  IMPRESSION: No evidence of active pulmonary disease.   Original Report Authenticated By: Burman Nieves, M.D.    Ct Head Wo Contrast  09/20/2012  *RADIOLOGY REPORT*  Clinical Data: Right facial droop.  CT HEAD WITHOUT CONTRAST  Technique:  Contiguous axial images were obtained from the base of the skull through the vertex without contrast.  Comparison: None.  Findings: There is focal area of mildly decreased attenuation in the left frontal deep white matter, extending to the cortex (example image 11).  Mild asymmetric edema is seen in the high left  frontal lobe, including the cortex, with extension to the left parietal lobe.  No associated acute hemorrhage.  No evidence of mass lesion, mass effect or hydrocephalus.  Visualized portions of the paranasal sinuses and mastoid air cells are clear.  IMPRESSION:  Low attenuation involving primarily the left frontal lobe, with some extension to the left parietal lobe, worrisome for acute or subacute infarction.  MR brain without and with contrast, including MR angiography, recommended. Critical Value/emergent results were called by telephone at the time of interpretation on 09/20/2012 at 1255 hours to Dr. Azalia Bilis, who verbally acknowledged these results.   Original Report Authenticated By: Leanna Battles, M.D.    Mr [facility] Wo Contrast  09/20/2012  *RADIOLOGY REPORT*  Clinical Data:  Stroke.  Hypertension, diabetes, hyperlipidemia. Right facial droop.  MRI HEAD WITHOUT CONTRAST MRA HEAD WITHOUT CONTRAST  Technique:  Multiplanar, multiecho pulse sequences of the brain and surrounding structures were obtained without intravenous contrast. Angiographic images of the  head were obtained using MRA technique without contrast.  Comparison:  CT 09/20/2012  MRI HEAD  Findings:  Acute infarct left frontal white matter and overlying cortex.  This extends back to the parietal lobe.  There is also a chronic infarct in the left parietal lobe with cortical volume loss.  Minimal chronic hemorrhage is present in the chronic left parietal infarct.  No other acute infarct.  Ventricle size is normal.  No mass lesion.  Brainstem and cerebellum are intact.  Negative for Chiari malformation. Pituitary is normal in size.  Paranasal sinuses are clear.  IMPRESSION: Acute infarct left frontal lobe.  Chronic infarct left parietal lobe.  MRA HEAD  Findings: Both vertebral arteries are patent to the basilar.  The basilar is widely patent.  Posterior cerebral arteries are patent bilaterally.  Left posterior communicating artery is patent.  The right posterior communicating artery is not patent.  Severe stenosis of the distal left internal carotid artery with decreased flow in the left A1 and left M1 segments.  There is poor visualization of left middle cerebral artery branches which may be due to chronic occlusion or slow flow.  The anterior communicating artery is patent and supplies the left anterior cerebral artery which is patent.  Mild stenosis of the right supraclinoid internal carotid artery. Right anterior and middle cerebral arteries are patent.  Negative for cerebral aneurysm.  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   Original Report Authenticated By: Janeece Riggers, M.D.    Mr Brain Wo Contrast  09/20/2012  *RADIOLOGY REPORT*  Clinical Data:  Stroke.  Hypertension, diabetes, hyperlipidemia. Right facial droop.  MRI HEAD WITHOUT CONTRAST MRA HEAD WITHOUT CONTRAST  Technique:  Multiplanar, multiecho pulse sequences of the brain and surrounding structures were obtained without intravenous contrast.  Angiographic images of the head were obtained using MRA technique without contrast.  Comparison:  CT 09/20/2012  MRI HEAD  Findings:  Acute infarct left frontal white matter and overlying cortex.  This extends back to the parietal lobe.  There is also a chronic infarct in the left parietal lobe with cortical volume loss.  Minimal chronic hemorrhage is present in the chronic left parietal infarct.  No other acute infarct.  Ventricle size is normal.  No mass lesion.  Brainstem and cerebellum are intact.  Negative for Chiari malformation. Pituitary is normal in size.  Paranasal sinuses are clear.  IMPRESSION: Acute infarct left frontal lobe.  Chronic  infarct left parietal lobe.  MRA HEAD  Findings: Both vertebral arteries are patent to the basilar.  The basilar is widely patent.  Posterior cerebral arteries are patent bilaterally.  Left posterior communicating artery is patent.  The right posterior communicating artery is not patent.  Severe stenosis of the distal left internal carotid artery with decreased flow in the left A1 and left M1 segments.  There is poor visualization of left middle cerebral artery branches which may be due to chronic occlusion or slow flow.  The anterior communicating artery is patent and supplies the left anterior cerebral artery which is patent.  Mild stenosis of the right supraclinoid internal carotid artery. Right anterior and middle cerebral arteries are patent.  Negative for cerebral aneurysm.  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   Original Report Authenticated By: Janeece Riggers, M.D.     Microbiology: No results found for this or any previous visit (from the past 240 hour(s)).   Labs: Basic Metabolic Panel:  Lab 09/20/12 1610 09/20/12 1247  NA -- 138  K -- 3.9  CL -- 101  CO2 -- 25  GLUCOSE -- 120*  BUN -- 19  CREATININE 0.66 0.77  CALCIUM -- 9.5  MG -- --  PHOS -- --    Liver Function Tests:  Lab 09/20/12 1247  AST 14  ALT 14  ALKPHOS 68  BILITOT 0.4  PROT 7.4  ALBUMIN 3.9   No results found for this basename: LIPASE:5,AMYLASE:5 in the last 168 hours  Lab 09/20/12 1313  AMMONIA 36   CBC:  Lab 09/20/12 1810 09/20/12 1247  WBC 5.4 5.0  NEUTROABS -- 2.9  HGB 11.7* 11.2*  HCT 34.0* 32.3*  MCV 83.7 84.3  PLT 209 196   Cardiac Enzymes: No results found for this basename: CKTOTAL:5,CKMB:5,CKMBINDEX:5,TROPONINI:5 in the last 168 hours BNP: BNP (last 3 results) No results found for this basename: PROBNP:3 in the last 8760 hours CBG:  Lab 09/22/12 1135 09/22/12 0642 09/21/12 2150 09/21/12 1640 09/21/12 1212  GLUCAP 144* 156* 160* 203* 136*       Signed:  Arlenne Kimbley A  [facility] 09/22/2012, 2:05 PM

## 2012-09-22 NOTE — Progress Notes (Signed)
Occupational Therapy Treatment Patient Details Name: Felicia Chavez MRN: [id number] DOB: [birth year] Today's Date: 09/22/2012 Time: 0865-7846 OT Time Calculation (min): 18 min  OT Assessment / Plan / Recommendation Comments on Treatment Session Pt educated in home program for R UE to improve coordination.  Pt ambulating tentatively, continues to have some delayed processing speed.  Daughters will supervise patient at home for safety/judgement errors.     Follow Up Recommendations  Home health OT;Supervision/Assistance - 24 hour    Barriers to Discharge       Equipment Recommendations       Recommendations for Other Services    Frequency     Plan Discharge plan remains appropriate    Precautions / Restrictions Precautions Precautions: Fall Restrictions Weight Bearing Restrictions: No   Pertinent Vitals/Pain No pain.    ADL  Toilet Transfer: Modified independent Toilet Transfer Method: Sit to Barista: Comfort height toilet Toileting - Clothing Manipulation and Hygiene: Independent Where Assessed - Toileting Clothing Manipulation and Hygiene: Sit to stand from 3-in-1 or toilet Equipment Used:  (theraputty, brown foam build up) Transfers/Ambulation Related to ADLs: mod I, ambulated slowly, but without assist, appears tentative ADL Comments: Instructed pt and daughter in home exercise program to increase R UE strength and coordination.  Issued med soft theraputty and foam build up for pen.  Written fine motor activities and theraputty handout reviewed and given to pt.    OT Diagnosis:    OT Problem List:   OT Treatment Interventions:     OT Goals ADL Goals Pt Will Perform Grooming: with supervision;Standing at sink ADL Goal: Grooming - Progress: Met Pt Will Perform Upper Body Bathing: with supervision;Standing at sink Pt Will Perform Lower Body Bathing: with supervision;Standing at sink Pt Will Perform Upper Body Dressing: with set-up;Sitting, bed Pt  Will Perform Lower Body Dressing: with set-up;Sit to stand from bed Pt Will Transfer to Toilet: with supervision;Ambulation;Comfort height toilet ADL Goal: Toilet Transfer - Progress: Met Miscellaneous OT Goals Miscellaneous OT Goal #1: Pt/daughter will be independent in R UE home program to increase coordination. OT Goal: Miscellaneous Goal #1 - Progress: Met Miscellaneous OT Goal #2: Daughter will be aware of safety concerns related to area effected by CVA. OT Goal: Miscellaneous Goal #2 - Progress: Met  Visit Information  Last OT Received On: 09/22/12 Assistance Needed: +1    Subjective Data      Prior Functioning  Home Living Lives With: Family Available Help at Discharge: Family;Available 24 hours/day Type of Home: Apartment Prior Function Vocation: Unemployed Dominant Hand: Right    Cognition  Cognition Overall Cognitive Status: Impaired Area of Impairment: Memory Arousal/Alertness: Awake/alert Behavior During Session: Flat affect Cognition - Other Comments: slow processing speed    Mobility  Bed Mobility Bed Mobility: Rolling Left;Left Sidelying to Sit;Sitting - Scoot to Edge of Bed Rolling Left: 7: Independent Left Sidelying to Sit: 7: Independent Sitting - Scoot to Edge of Bed: 7: Independent Transfers Transfers: Sit to Stand;Stand to Sit Sit to Stand: From bed;6: Modified independent (Device/Increase time);From toilet Stand to Sit: 6: Modified independent (Device/Increase time);To toilet;To bed    Exercises      Balance     End of Session OT - End of Session Activity Tolerance: Patient tolerated treatment well Patient left: in bed;with family/visitor present;with call bell/phone within reach (EOB)  GO     Evern Bio 09/22/2012, 2:21 PM [phone]

## 2012-09-22 NOTE — Progress Notes (Addendum)
NCM spoke to pt and gave permission to speak with son, Amil Amen [phone] or dtr, Reginold Agent # [phone]. Family concerned about Medicaid. States they went to hearing this am and she was denied her Medicaid. They are going to need additional info about this hospital stay. NCM contacted financial counselor and they will follow up with Dedra Skeens, hearing officer and fax all the necessary information for dc. NCM contacted [facility] for [facility] for scheduled dc today. Pt states she is having difficulty with paying for her medications. Will use MATCH program for medication assistance. NCM provided pt with RX Outreach application for Plavix and simvastatin. Pt will need PCP to complete and fax to program for discount on Plavix if she is not approved for Medicaid. Explained to pt and family MATCH can be used only once per year. Spoke to Artist and info was faxed to [employer].  Isidoro Donning RN CCM Case Mgmt phone [phone]

## 2012-09-22 NOTE — Progress Notes (Signed)
"  [facility]" stroke education manuel given and explained to patient and son Tobey Grim. Questions answered. Pt discharged home in good and stable condition.

## 2012-09-24 ENCOUNTER — Ambulatory Visit (HOSPITAL_COMMUNITY)
Admit: 2012-09-24 | Discharge: 2012-09-24 | Disposition: A | Discharge status: Home / Self Care | Payer: Self-pay | Attending: Interventional Radiology | Admitting: Interventional Radiology

## 2012-09-28 ENCOUNTER — Other Ambulatory Visit (HOSPITAL_COMMUNITY): Payer: Self-pay | Admitting: Interventional Radiology

## 2012-09-28 DIAGNOSIS — I639 Cerebral infarction, unspecified: Secondary | ICD-10-CM

## 2012-09-28 DIAGNOSIS — R29810 Facial weakness: Secondary | ICD-10-CM

## 2012-09-28 DIAGNOSIS — I6529 Occlusion and stenosis of unspecified carotid artery: Secondary | ICD-10-CM

## 2012-10-04 ENCOUNTER — Emergency Department (INDEPENDENT_AMBULATORY_CARE_PROVIDER_SITE_OTHER)
Admission: EM | Admit: 2012-10-04 | Discharge: 2012-10-04 | Disposition: A | Discharge status: Home / Self Care | Payer: Self-pay | Source: Home / Self Care | Attending: Family Medicine | Admitting: Family Medicine

## 2012-10-04 ENCOUNTER — Encounter (HOSPITAL_COMMUNITY): Payer: Self-pay

## 2012-10-04 DIAGNOSIS — I10 Essential (primary) hypertension: Secondary | ICD-10-CM

## 2012-10-04 DIAGNOSIS — E785 Hyperlipidemia, unspecified: Secondary | ICD-10-CM

## 2012-10-04 DIAGNOSIS — E119 Type 2 diabetes mellitus without complications: Secondary | ICD-10-CM

## 2012-10-04 DIAGNOSIS — I6930 Unspecified sequelae of cerebral infarction: Secondary | ICD-10-CM

## 2012-10-04 DIAGNOSIS — R29810 Facial weakness: Secondary | ICD-10-CM

## 2012-10-04 NOTE — ED Notes (Signed)
Patient states this is a follow up from recent hospital stay.DX: stroke

## 2012-10-04 NOTE — ED Provider Notes (Signed)
History     CSN: [id number]  Arrival date & time 10/04/12  1619   First MD Initiated Contact with Patient 10/04/12 1811      Chief Complaint  Patient presents with  . Follow-up   HPI The patient is presenting today for hospital followup visit.  The patient was admitted into the hospital for any recurrent cerebrovascular accident.  She has a history of hypertension and diabetes mellitus.  She is currently been living in [place].  She is recently relocated to this area to live with her daughter.  The patient initially had some residual weakness in the right upper extremity from her previous stroke.  The most recent stroke left her with difficulty speaking and also with a facial droop.  The patient reports that she is working with occupational therapy and physical therapy at home and she has experienced significant improvement since being discharged from the hospital recently.  She is taking her medications as prescribed.  She is requesting assistance with paperwork for Plavix to be delivered to her home through the drug company.  She reports that she is testing her blood glucose but ran out of strips for.  She reports that she is going to try [employer] for a blood glucose machine.  Past Medical History  Diagnosis Date  . Hypertension   . Diabetes mellitus without complication    History reviewed. No pertinent past surgical history.  FAMILY HISTORY Hypertension Diabetes mellitus  History  Substance Use Topics  . Smoking status: Never Smoker   . Smokeless tobacco: Not on file  . Alcohol Use: No    OB History   Grav Para Term Preterm Abortions TAB SAB Ect Mult Living                 Review of Systems  Neurological: Positive for facial asymmetry, speech difficulty, weakness and numbness.  All other systems reviewed and are negative.   Allergies  Review of patient's allergies indicates no known allergies.  Home Medications   Current Outpatient Rx  Name  Route  Sig  Dispense   Refill  . aspirin EC 81 MG tablet   Oral   Take 81 mg by mouth daily.         . clopidogrel (PLAVIX) 75 MG tablet   Oral   Take 1 tablet (75 mg total) by mouth daily with breakfast.   34 tablet   0   . gabapentin (NEURONTIN) 100 MG capsule   Oral   Take 1 capsule (100 mg total) by mouth 3 (three) times daily.   102 capsule   0   . glipiZIDE (GLUCOTROL XL) 5 MG 24 hr tablet   Oral   Take 1 tablet (5 mg total) by mouth daily.   30 tablet   0   . lisinopril (PRINIVIL,ZESTRIL) 10 MG tablet   Oral   Take 1 tablet (10 mg total) by mouth daily.   34 tablet   0   . metFORMIN (GLUCOPHAGE) 1000 MG tablet   Oral   Take 1 tablet (1,000 mg total) by mouth 2 (two) times daily with a meal. Please start on 09/24/2012   68 tablet   0   . pravastatin (PRAVACHOL) 40 MG tablet   Oral   Take 1 tablet (40 mg total) by mouth daily.   34 tablet   0     BP 107/82  Pulse 104  Temp(Src) 98 F (36.7 C) (Oral)  SpO2 100%  Physical Exam  Nursing note  and vitals reviewed. Constitutional: She is oriented to person, place, and time. She appears well-developed and well-nourished. No distress.  HENT:  Head: Normocephalic and atraumatic.  Eyes: Conjunctivae and EOM are normal. Pupils are equal, round, and reactive to light.  Neck: Normal range of motion. Neck supple. No JVD present. No tracheal deviation present. No thyromegaly present.  Cardiovascular: Normal rate, regular rhythm and normal heart sounds.   Pulmonary/Chest: Effort normal and breath sounds normal.  Abdominal: Soft. Bowel sounds are normal. She exhibits no distension and no mass. There is no tenderness. There is no rebound and no guarding.  Lymphadenopathy:    She has no cervical adenopathy.  Neurological: She is alert and oriented to person, place, and time. She displays abnormal reflex. A cranial nerve deficit is present. She exhibits abnormal muscle tone. Coordination abnormal.   Mild facial droop present  Skin: Skin is  warm and dry. No rash noted. No erythema. No pallor.  Psychiatric: She has a normal mood and affect. Her behavior is normal. Judgment and thought content normal.    ED Course  Procedures (including critical care time)  Labs Reviewed - No data to display No results found.  No diagnosis found.  MDM  IMPRESSION  Hypertension  Hyperlipidema  Cerebrovascular disease  Diabetes Mellitus type 2   RECOMMENDATIONS / PLAN The patient's blood sugar and blood pressure is well-controlled at this time.  I reviewed her records from her hospitalization.  I completed some of the forms that she needed to be filled for FMLA and for Plavix.  I would like for her to continue her medications at this time.  No changes made to her medication regimen.  I would like close followup with her.  I encouraged continued physical therapy and occupational therapy at home as arranged.  Recheck labs on followup visit.  Patient is scheduled to follow up with interventional radiology in the next several days.   FOLLOW UP 1 month   The patient was given clear instructions to go to ER or return to medical center if symptoms don't improve, worsen or new problems develop.  The patient verbalized understanding.  The patient was told to call to get lab results if they haven't heard anything in the next week.            Cleora Fleet, MD 10/04/12 2009

## 2012-10-08 ENCOUNTER — Encounter (HOSPITAL_COMMUNITY): Payer: Self-pay | Admitting: Respiratory Therapy

## 2012-10-12 ENCOUNTER — Other Ambulatory Visit: Payer: Self-pay | Admitting: Radiology

## 2012-10-13 ENCOUNTER — Encounter (HOSPITAL_COMMUNITY): Payer: Self-pay

## 2012-10-13 ENCOUNTER — Encounter (HOSPITAL_COMMUNITY)
Admission: RE | Admit: 2012-10-13 | Discharge: 2012-10-13 | Disposition: A | Discharge status: Home / Self Care | Payer: Medicaid Other | Source: Ambulatory Visit | Attending: Interventional Radiology | Admitting: Interventional Radiology

## 2012-10-13 HISTORY — DX: Gastro-esophageal reflux disease without esophagitis: K21.9

## 2012-10-13 HISTORY — DX: Cerebral infarction, unspecified: I63.9

## 2012-10-13 HISTORY — DX: Unspecified osteoarthritis, unspecified site: M19.90

## 2012-10-13 LAB — CBC WITH DIFFERENTIAL/PLATELET
Eosinophils Absolute: 0.1 10*3/uL (ref 0.0–0.7)
Hemoglobin: 11.5 g/dL — ABNORMAL LOW (ref 12.0–15.0)
Lymphs Abs: 1.7 10*3/uL (ref 0.7–4.0)
MCH: 29 pg (ref 26.0–34.0)
Monocytes Relative: 7 % (ref 3–12)
Neutro Abs: 2.1 10*3/uL (ref 1.7–7.7)
Neutrophils Relative %: 50 % (ref 43–77)
Platelets: 208 10*3/uL (ref 150–400)
RBC: 3.97 MIL/uL (ref 3.87–5.11)
WBC: 4.2 10*3/uL (ref 4.0–10.5)

## 2012-10-13 LAB — COMPREHENSIVE METABOLIC PANEL
ALT: 11 U/L (ref 0–35)
Albumin: 4.1 g/dL (ref 3.5–5.2)
Alkaline Phosphatase: 80 U/L (ref 39–117)
Chloride: 99 mEq/L (ref 96–112)
GFR calc Af Amer: >90 mL/min (ref >90)
Glucose, Bld: 150 mg/dL — ABNORMAL HIGH (ref 70–99)
Potassium: 4.1 mEq/L (ref 3.5–5.1)
Sodium: 138 mEq/L (ref 135–145)
Total Bilirubin: 0.2 mg/dL — ABNORMAL LOW (ref 0.3–1.2)
Total Protein: 8.1 g/dL (ref 6.0–8.3)

## 2012-10-13 LAB — APTT: aPTT: 28 seconds (ref 24–37)

## 2012-10-13 NOTE — Pre-Procedure Instructions (Signed)
Felicia Chavez  10/13/2012   Your procedure is scheduled on:  Monday October 18, 2012  Report to [facility] Short Stay [facility] at 0600 AM.  Call this number if you have problems the morning of surgery: [phone]   Remember:   Do not eat food or drink liquids after midnight.Sunday   Take these medicines the morning of surgery with A SIP OF WATER:    Do not wear jewelry, make-up or nail polish.  Do not wear lotions, powders, or perfumes. You may wear deodorant.  Do not shave 48 hours prior to surgery.   Do not bring valuables to the hospital.  Contacts, dentures or bridgework may not be worn into surgery.  Leave suitcase in the car. After surgery it may be brought to your room.  For patients admitted to the hospital, checkout time is 11:00 AM the day of  discharge.   Patients discharged the day of surgery will not be allowed to drive  home.    Special Instructions: Shower using CHG 2 nights before surgery and the night before surgery.  If you shower the day of surgery use CHG.  Use special wash - you have one bottle of CHG for all showers.  You should use approximately 1/3 of the bottle for each shower.   Please read over the following fact sheets that you were given: Pain Booklet, Coughing and Deep Breathing, MRSA Information and Surgical Site Infection Prevention

## 2012-10-15 ENCOUNTER — Encounter (HOSPITAL_COMMUNITY): Payer: Self-pay

## 2012-10-18 ENCOUNTER — Ambulatory Visit (HOSPITAL_COMMUNITY): Admission: RE | Admit: 2012-10-18 | Payer: Self-pay | Source: Ambulatory Visit | Admitting: Interventional Radiology

## 2012-10-18 ENCOUNTER — Ambulatory Visit (HOSPITAL_COMMUNITY)
Admission: RE | Admit: 2012-10-18 | Discharge: 2012-10-18 | Disposition: A | Discharge status: Home / Self Care | Payer: Medicaid Other | Source: Ambulatory Visit | Attending: Interventional Radiology | Admitting: Interventional Radiology

## 2012-10-18 ENCOUNTER — Encounter (HOSPITAL_COMMUNITY)
Admission: RE | Disposition: A | Discharge status: Home / Self Care | Payer: Self-pay | Source: Ambulatory Visit | Attending: Interventional Radiology

## 2012-10-18 ENCOUNTER — Encounter (HOSPITAL_COMMUNITY): Admission: RE | Payer: Self-pay | Source: Ambulatory Visit

## 2012-10-18 ENCOUNTER — Encounter (HOSPITAL_COMMUNITY): Payer: Self-pay | Admitting: Surgery

## 2012-10-18 ENCOUNTER — Encounter (HOSPITAL_COMMUNITY): Payer: Self-pay | Admitting: Anesthesiology

## 2012-10-18 ENCOUNTER — Encounter (HOSPITAL_COMMUNITY): Payer: Self-pay

## 2012-10-18 ENCOUNTER — Ambulatory Visit (HOSPITAL_COMMUNITY): Payer: Medicaid Other | Admitting: Anesthesiology

## 2012-10-18 VITALS — BP 126/80 | HR 90 | Temp 98.5°F | Resp 20

## 2012-10-18 DIAGNOSIS — F17200 Nicotine dependence, unspecified, uncomplicated: Secondary | ICD-10-CM | POA: Insufficient documentation

## 2012-10-18 DIAGNOSIS — M129 Arthropathy, unspecified: Secondary | ICD-10-CM | POA: Insufficient documentation

## 2012-10-18 DIAGNOSIS — K219 Gastro-esophageal reflux disease without esophagitis: Secondary | ICD-10-CM | POA: Insufficient documentation

## 2012-10-18 DIAGNOSIS — I10 Essential (primary) hypertension: Secondary | ICD-10-CM | POA: Insufficient documentation

## 2012-10-18 DIAGNOSIS — Z7982 Long term (current) use of aspirin: Secondary | ICD-10-CM | POA: Insufficient documentation

## 2012-10-18 DIAGNOSIS — I6529 Occlusion and stenosis of unspecified carotid artery: Secondary | ICD-10-CM | POA: Insufficient documentation

## 2012-10-18 DIAGNOSIS — Z7902 Long term (current) use of antithrombotics/antiplatelets: Secondary | ICD-10-CM | POA: Insufficient documentation

## 2012-10-18 DIAGNOSIS — I658 Occlusion and stenosis of other precerebral arteries: Secondary | ICD-10-CM | POA: Insufficient documentation

## 2012-10-18 DIAGNOSIS — E119 Type 2 diabetes mellitus without complications: Secondary | ICD-10-CM | POA: Insufficient documentation

## 2012-10-18 DIAGNOSIS — Z8673 Personal history of transient ischemic attack (TIA), and cerebral infarction without residual deficits: Secondary | ICD-10-CM | POA: Insufficient documentation

## 2012-10-18 DIAGNOSIS — Z01812 Encounter for preprocedural laboratory examination: Secondary | ICD-10-CM | POA: Insufficient documentation

## 2012-10-18 DIAGNOSIS — I639 Cerebral infarction, unspecified: Secondary | ICD-10-CM

## 2012-10-18 HISTORY — PX: RADIOLOGY WITH ANESTHESIA: SHX6223

## 2012-10-18 LAB — GLUCOSE, CAPILLARY: Glucose-Capillary: 142 mg/dL — ABNORMAL HIGH (ref 70–99)

## 2012-10-18 SURGERY — RADIOLOGY WITH ANESTHESIA
Anesthesia: General

## 2012-10-18 MED ORDER — SODIUM CHLORIDE 0.9 % IV SOLN: INTRAVENOUS | Status: AC

## 2012-10-18 MED ORDER — NIMODIPINE 30 MG PO CAPS
ORAL_CAPSULE | ORAL | Status: AC
  Administered 2012-10-18: 60 mg via ORAL
  Filled 2012-10-18: qty 2

## 2012-10-18 MED ORDER — FENTANYL CITRATE 0.05 MG/ML IJ SOLN
INTRAMUSCULAR | Status: DC | PRN
  Administered 2012-10-18: 50 ug via INTRAVENOUS

## 2012-10-18 MED ORDER — NIMODIPINE 30 MG PO CAPS
60.0000 mg | ORAL_CAPSULE | ORAL | Status: DC
  Filled 2012-10-18: qty 2

## 2012-10-18 MED ORDER — CEFAZOLIN SODIUM-DEXTROSE 2-3 GM-% IV SOLR
2.0000 g | Freq: Once | INTRAVENOUS | Status: DC
  Filled 2012-10-18: qty 50

## 2012-10-18 MED ORDER — LACTATED RINGERS IV SOLN
INTRAVENOUS | Status: DC | PRN
  Administered 2012-10-18: 08:00:00 via INTRAVENOUS

## 2012-10-18 MED ORDER — SODIUM CHLORIDE 0.9 % IV SOLN: Freq: Once | INTRAVENOUS | Status: DC

## 2012-10-18 MED ORDER — NITROGLYCERIN 1 MG/10 ML FOR IR/CATH LAB
INTRA_ARTERIAL | Status: AC
  Filled 2012-10-18: qty 10

## 2012-10-18 MED ORDER — ASPIRIN EC 325 MG PO TBEC
325.0000 mg | DELAYED_RELEASE_TABLET | Freq: Once | ORAL | Status: AC
  Administered 2012-10-18: 325 mg via ORAL
  Filled 2012-10-18 (×2): qty 1

## 2012-10-18 MED ORDER — MIDAZOLAM HCL 5 MG/5ML IJ SOLN
INTRAMUSCULAR | Status: DC | PRN
  Administered 2012-10-18: 0.5 mg via INTRAVENOUS

## 2012-10-18 MED ORDER — CEFAZOLIN SODIUM 1-5 GM-% IV SOLN
INTRAVENOUS | Status: AC
  Administered 2012-10-18: 1 g via INTRAVENOUS
  Filled 2012-10-18: qty 50

## 2012-10-18 MED ORDER — LACTATED RINGERS IV SOLN: INTRAVENOUS | Status: DC | PRN

## 2012-10-18 MED ORDER — IOHEXOL 300 MG/ML  SOLN
150.0000 mL | Freq: Once | INTRAMUSCULAR | Status: AC | PRN
  Administered 2012-10-18: 60 mL via INTRAVENOUS

## 2012-10-18 MED ORDER — CLOPIDOGREL BISULFATE 75 MG PO TABS
75.0000 mg | ORAL_TABLET | Freq: Once | ORAL | Status: AC
  Administered 2012-10-18: 75 mg via ORAL
  Filled 2012-10-18 (×2): qty 1

## 2012-10-18 NOTE — Transfer of Care (Signed)
Immediate Anesthesia Transfer of Care Note  Patient: Felicia Chavez  Procedure(s) Performed: Procedure(s): RADIOLOGY WITH ANESTHESIA (N/A)  Patient Location: Radiology  Anesthesia Type:MAC  Level of Consciousness: awake, alert  and oriented  Airway & Oxygen Therapy: Patient Spontanous Breathing and Patient connected to nasal cannula oxygen  Post-op Assessment: Report given to PACU RN, Post -op Vital signs reviewed and stable and Patient moving all extremities X 4  Post vital signs: Reviewed and stable  Complications: No apparent anesthesia complications

## 2012-10-18 NOTE — Progress Notes (Signed)
Report given to [employer] transferred to C5 via cart with RN

## 2012-10-18 NOTE — Anesthesia Preprocedure Evaluation (Addendum)
Anesthesia Evaluation  Patient identified by MRN, date of birth, ID band Patient awake    Reviewed: Allergy & Precautions, H&P , NPO status , Patient's Chart, lab work & pertinent test results  Airway Mallampati: II TM Distance: >3 FB Neck ROM: Full    Dental no notable dental hx. (+) Partial Lower, Teeth Intact and Dental Advisory Given   Pulmonary neg pulmonary ROS,  breath sounds clear to auscultation  Pulmonary exam normal       Cardiovascular hypertension, Pt. on medications Rhythm:Regular Rate:Normal     Neuro/Psych CVA, Residual Symptoms negative psych ROS   GI/Hepatic Neg liver ROS, GERD-  Medicated and Controlled,  Endo/Other  negative endocrine ROSdiabetes  Renal/GU negative Renal ROS  negative genitourinary   Musculoskeletal   Abdominal   Peds  Hematology negative hematology ROS (+)   Anesthesia Other Findings   Reproductive/Obstetrics negative OB ROS                          Anesthesia Physical Anesthesia Plan  ASA: III  Anesthesia Plan: General   Post-op Pain Management:    Induction: Intravenous  Airway Management Planned: Oral ETT  Additional Equipment: Arterial line  Intra-op Plan:   Post-operative Plan: Extubation in OR  Informed Consent: I have reviewed the patients History and Physical, chart, labs and discussed the procedure including the risks, benefits and alternatives for the proposed anesthesia with the patient or authorized representative who has indicated his/her understanding and acceptance.   Dental advisory given  Plan Discussed with: CRNA  Anesthesia Plan Comments:         Anesthesia Quick Evaluation

## 2012-10-18 NOTE — Anesthesia Postprocedure Evaluation (Signed)
  Anesthesia Post-op Note  Patient: Felicia Chavez  Procedure(s) Performed: Procedure(s): RADIOLOGY WITH ANESTHESIA (N/A)  Patient Location: Short Stay  Anesthesia Type:MAC  Level of Consciousness: awake, alert  and oriented  Airway and Oxygen Therapy: Patient Spontanous Breathing  Post-op Pain: none  Post-op Assessment: Post-op Vital signs reviewed, Patient's Cardiovascular Status Stable, Respiratory Function Stable, Patent Airway and No signs of Nausea or vomiting  Post-op Vital Signs: Reviewed and stable  Complications: No apparent anesthesia complications

## 2012-10-18 NOTE — H&P (Signed)
Felicia Chavez is an 52 y.o. female.   Chief Complaint: CVA 09/20/2012 Cerebral arteriogram 09/22/12 reveals tight Left Internal carotid stenosis 09/28/12: consultation with Dr Corliss Skains Scheduled now for cerebral arteriogram with probable angioplasty/stent placement of stenotic L ICA HPI: HTN; DM; CVA; GERD  Past Medical History  Diagnosis Date  . Hypertension   . Diabetes mellitus without complication   . Stroke   . GERD (gastroesophageal reflux disease)   . Arthritis     Past Surgical History  Procedure Laterality Date  . Tubal ligation      No family history on file. Social History:  reports that she has been smoking Cigarettes.  She has a 1 pack-year smoking history. She has never used smokeless tobacco. She reports that  drinks alcohol. She reports that she does not use illicit drugs.  Allergies: No Known Allergies   (Not in a hospital admission)  Results for orders placed during the hospital encounter of 10/18/12 (from the past 48 hour(s))  GLUCOSE, CAPILLARY     Status: Abnormal   Collection Time    10/18/12  6:54 AM      Result Value Range   Glucose-Capillary 159 (*) 70 - 99 mg/dL   No results found.  Review of Systems  Constitutional: Negative for fever.  Eyes: Negative for blurred vision.  Respiratory: Negative for shortness of breath.   Cardiovascular: Negative for chest pain.  Gastrointestinal: Negative for nausea, vomiting and abdominal pain.  Neurological: Positive for focal weakness. Negative for dizziness and headaches.       Rt side still sl weak    There were no vitals taken for this visit. Physical Exam  Constitutional: She is oriented to person, place, and time. She appears well-developed and well-nourished.  Eyes: EOM are normal.  Neck: Normal range of motion.  Cardiovascular: Normal rate, regular rhythm and normal heart sounds.   No murmur heard. Respiratory: Effort normal. She has no wheezes.  GI: Soft. There is no tenderness.   Musculoskeletal: Normal range of motion. She exhibits no edema and no tenderness.  Neurological: She is alert and oriented to person, place, and time. No cranial nerve deficit. Coordination normal.  Skin: Skin is warm and dry.  Psychiatric: She has a normal mood and affect. Her behavior is normal. Judgment and thought content normal.     Assessment/Plan CVA L ICA stenosis on catheter angiogram Scheduled now for probable angioplasty/stent Pt aware of procedure benefits and risks and agreeable to proceed Consent signed and in chart Pt aware if intervention performed she will be admitted overnight to Neuro ICU   TURPIN,PAMELA A 10/18/2012, 7:33 AM

## 2012-10-18 NOTE — Preoperative (Signed)
Beta Blockers   Reason not to administer Beta Blockers: not prescribed 

## 2012-10-18 NOTE — Procedures (Signed)
S/P bilateral common carotid artery arteriograms  Rt CFA approach. Finding . 1 Slightly worse LT ICA supraclinoid stenosis but with more collaterals  2.Rt ICA stenosis of 60 % prox

## 2012-10-18 NOTE — Progress Notes (Signed)
RECEIVED REPORT FROM C LAMB,RN AND PER CRYSTAL LAMB,RN AND PER CLIENT MAY BE UP AT 1330

## 2012-10-18 NOTE — Progress Notes (Signed)
Short stay notified for bed 

## 2012-10-19 ENCOUNTER — Encounter (HOSPITAL_COMMUNITY): Payer: Self-pay | Admitting: Interventional Radiology

## 2012-11-04 ENCOUNTER — Encounter (HOSPITAL_COMMUNITY): Payer: Self-pay

## 2012-11-04 ENCOUNTER — Emergency Department (INDEPENDENT_AMBULATORY_CARE_PROVIDER_SITE_OTHER)
Admission: EM | Admit: 2012-11-04 | Discharge: 2012-11-04 | Disposition: A | Discharge status: Home / Self Care | Payer: Self-pay | Source: Home / Self Care

## 2012-11-04 DIAGNOSIS — I6990 Unspecified sequelae of unspecified cerebrovascular disease: Secondary | ICD-10-CM

## 2012-11-04 MED ORDER — LISINOPRIL 10 MG PO TABS: 10.0000 mg | ORAL_TABLET | Freq: Every day | ORAL | Status: DC

## 2012-11-04 MED ORDER — METFORMIN HCL 1000 MG PO TABS: 1000.0000 mg | ORAL_TABLET | Freq: Two times a day (BID) | ORAL | Status: DC

## 2012-11-04 MED ORDER — GLIPIZIDE ER 5 MG PO TB24: 5.0000 mg | ORAL_TABLET | Freq: Every day | ORAL | Status: DC

## 2012-11-04 MED ORDER — ASPIRIN EC 81 MG PO TBEC: 81.0000 mg | DELAYED_RELEASE_TABLET | Freq: Every day | ORAL | Status: DC

## 2012-11-04 MED ORDER — GABAPENTIN 100 MG PO CAPS: 100.0000 mg | ORAL_CAPSULE | Freq: Three times a day (TID) | ORAL | Status: DC

## 2012-11-04 MED ORDER — FREESTYLE SYSTEM KIT: 1.0000 | PACK | Freq: Three times a day (TID) | Status: AC

## 2012-11-04 MED ORDER — CLOPIDOGREL BISULFATE 75 MG PO TABS: 75.0000 mg | ORAL_TABLET | Freq: Every day | ORAL | Status: DC

## 2012-11-04 MED ORDER — PRAVASTATIN SODIUM 40 MG PO TABS: 40.0000 mg | ORAL_TABLET | Freq: Every day | ORAL | Status: DC

## 2012-11-04 NOTE — ED Notes (Signed)
Patient Demographics  Felicia Chavez, is a 52 y.o. female  NFA:[id number]  ION:[id number]  DOB - March 06, 1961  Chief Complaint  Patient presents with  . Medication Refill        Subjective:   Felicia Chavez  patient who was recently admitted to the hospital with left MCA CVA causing right-sided weakness which is improving, carotid artery stenosis, she was seen by neurology and placed on aspirin and Plavix combination, she is also a type II diabetic and has dyslipidemia. She is here for routine followup visit to get medications refilled. Denies any headache, no new problem with hearing, right arm weakness is improving. She does smoke and drink alcohol and has been counseled to quit both.   Objective:    Filed Vitals:   11/04/12 1021  BP: 118/94  Pulse: 93  Temp: 97.6 F (36.4 C)  TempSrc: Oral  SpO2: 100%     Exam  Awake Alert, Oriented X 3, No new F.N deficits, Normal affect, right-sided strength 4/5. [place].AT,PERRAL Supple Neck,No JVD, No cervical lymphadenopathy appriciated.  Symmetrical Chest wall movement, Good air movement bilaterally, CTAB RRR,No Gallops,Rubs or new Murmurs, No Parasternal Heave +ve B.Sounds, Abd Soft, Non tender, No organomegaly appriciated, No rebound - guarding or rigidity. No Cyanosis, Clubbing or edema, No new Rash or bruise      Data Review   CBC No results found for this basename: WBC, HGB, HCT, PLT, MCV, MCH, MCHC, RDW, NEUTRABS, LYMPHSABS, MONOABS, EOSABS, BASOSABS, BANDABS, BANDSABD,  in the last 168 hours  Chemistries   No results found for this basename: NA, K, CL, CO2, GLUCOSE, BUN, CREATININE, GFRCGP, CALCIUM, MG, AST, ALT, ALKPHOS, BILITOT,  in the last 168 hours ------------------------------------------------------------------------------------------------------------------ No results found for this basename: HGBA1C,  in the last 72  hours ------------------------------------------------------------------------------------------------------------------ No results found for this basename: CHOL, HDL, LDLCALC, TRIG, CHOLHDL, LDLDIRECT,  in the last 72 hours ------------------------------------------------------------------------------------------------------------------ No results found for this basename: TSH, T4TOTAL, FREET3, T3FREE, THYROIDAB,  in the last 72 hours ------------------------------------------------------------------------------------------------------------------ No results found for this basename: VITAMINB12, FOLATE, FERRITIN, TIBC, IRON, RETICCTPCT,  in the last 72 hours  Coagulation profile  No results found for this basename: INR, PROTIME,  in the last 168 hours     Prior to Admission medications   Medication Sig Start Date End Date Taking? Authorizing Provider  aspirin EC 81 MG tablet Take 1 tablet (81 mg total) by mouth daily. 11/04/12   Leroy Sea, MD  clopidogrel (PLAVIX) 75 MG tablet Take 1 tablet (75 mg total) by mouth daily with breakfast. 11/04/12   Leroy Sea, MD  gabapentin (NEURONTIN) 100 MG capsule Take 1 capsule (100 mg total) by mouth 3 (three) times daily. 11/04/12   Leroy Sea, MD  glipiZIDE (GLUCOTROL XL) 5 MG 24 hr tablet Take 1 tablet (5 mg total) by mouth daily. 11/04/12   Leroy Sea, MD  glucose monitoring kit (FREESTYLE) monitoring kit 1 each by Does not apply route 4 (four) times daily - after meals and at bedtime. 1 month Diabetic Testing Supplies for QAC-QHS accuchecks. 11/04/12   Leroy Sea, MD  lisinopril (PRINIVIL,ZESTRIL) 10 MG tablet Take 1 tablet (10 mg total) by mouth daily. 11/04/12   Leroy Sea, MD  metFORMIN (GLUCOPHAGE) 1000 MG tablet Take 1 tablet (1,000 mg total) by mouth 2 (two) times daily with a meal. Please start on 09/24/2012 11/04/12   Leroy Sea, MD  pravastatin (PRAVACHOL) 40 MG tablet Take 1 tablet (40 mg total) by  mouth  daily. 11/04/12   Leroy Sea, MD     Assessment & Plan   Recent history of left MCA CVA. Type II diabetic, hypertensive, dyslipidemia, carotid artery stenosis, smoker, alcohol abuse. Medication refills were provided, requested her to do Accu-Cheks q. a.c. at bedtime provider but testing supplies, counseled her to quit smoking and alcohol use. Will check CMP, TSH, A1c and lipid panel a day before her next visit in one month. Outpatient neurology followup set.    Follow-up Information   Schedule an appointment as soon as possible for a visit in 1 month to follow up. (Come back the day before to get blood work drawn in the morning empty stomach)        Leroy Sea M.D on 11/04/2012 at 10:33 AM   Leroy Sea, MD 11/04/12 1037

## 2012-11-04 NOTE — ED Notes (Signed)
Patient here for medication refill\ History of stroke

## 2012-12-15 ENCOUNTER — Emergency Department (HOSPITAL_COMMUNITY)
Admission: EM | Admit: 2012-12-15 | Discharge: 2012-12-15 | Disposition: A | Discharge status: Home / Self Care | Payer: Self-pay | Source: Home / Self Care

## 2012-12-15 NOTE — ED Notes (Signed)
Patient here for blood work that Dr Thedore Mins ordered cmet Lipid hgba1c Cbc tsh

## 2012-12-15 NOTE — ED Notes (Signed)
Patient was here for blood work Patient was dehydrated could not get labs Scheduled her to come back

## 2013-01-25 ENCOUNTER — Other Ambulatory Visit: Payer: Self-pay | Admitting: Radiology

## 2013-01-25 DIAGNOSIS — I6529 Occlusion and stenosis of unspecified carotid artery: Secondary | ICD-10-CM

## 2013-02-10 ENCOUNTER — Encounter (HOSPITAL_COMMUNITY): Payer: Self-pay | Admitting: Unknown Physician Specialty

## 2013-02-10 ENCOUNTER — Ambulatory Visit (HOSPITAL_COMMUNITY)
Admission: RE | Admit: 2013-02-10 | Discharge: 2013-02-10 | Disposition: A | Discharge status: Home / Self Care | Payer: Medicaid Other | Source: Ambulatory Visit | Attending: Interventional Radiology | Admitting: Interventional Radiology

## 2013-02-10 DIAGNOSIS — I6529 Occlusion and stenosis of unspecified carotid artery: Secondary | ICD-10-CM | POA: Insufficient documentation

## 2013-02-10 DIAGNOSIS — I6350 Cerebral infarction due to unspecified occlusion or stenosis of unspecified cerebral artery: Secondary | ICD-10-CM

## 2013-02-10 NOTE — Progress Notes (Signed)
VASCULAR LAB PRELIMINARY  PRELIMINARY  PRELIMINARY  PRELIMINARY  Carotid duplex completed.    Preliminary report:  Bilateral - Less than 40% ICA stenosis. Vertebral artery flow is antegrade.  Niesha Bame, RVS 02/10/2013, 6:18 PM

## 2013-04-21 ENCOUNTER — Other Ambulatory Visit: Payer: Self-pay | Admitting: Radiology

## 2013-04-21 DIAGNOSIS — I6529 Occlusion and stenosis of unspecified carotid artery: Secondary | ICD-10-CM

## 2013-05-02 ENCOUNTER — Other Ambulatory Visit: Payer: Self-pay | Admitting: Internal Medicine

## 2013-05-17 ENCOUNTER — Ambulatory Visit (HOSPITAL_COMMUNITY)
Admission: RE | Admit: 2013-05-17 | Discharge: 2013-05-17 | Disposition: A | Discharge status: Home / Self Care | Payer: Medicaid Other | Source: Ambulatory Visit | Attending: Interventional Radiology | Admitting: Interventional Radiology

## 2013-05-17 DIAGNOSIS — R51 Headache: Secondary | ICD-10-CM | POA: Insufficient documentation

## 2013-05-17 DIAGNOSIS — I6930 Unspecified sequelae of cerebral infarction: Secondary | ICD-10-CM

## 2013-05-17 DIAGNOSIS — I6529 Occlusion and stenosis of unspecified carotid artery: Secondary | ICD-10-CM

## 2013-05-17 DIAGNOSIS — I658 Occlusion and stenosis of other precerebral arteries: Secondary | ICD-10-CM | POA: Insufficient documentation

## 2013-05-17 DIAGNOSIS — E785 Hyperlipidemia, unspecified: Secondary | ICD-10-CM | POA: Insufficient documentation

## 2013-05-17 DIAGNOSIS — I10 Essential (primary) hypertension: Secondary | ICD-10-CM | POA: Insufficient documentation

## 2013-05-17 DIAGNOSIS — R29810 Facial weakness: Secondary | ICD-10-CM | POA: Insufficient documentation

## 2013-05-17 DIAGNOSIS — I6990 Unspecified sequelae of unspecified cerebrovascular disease: Secondary | ICD-10-CM | POA: Insufficient documentation

## 2013-05-17 NOTE — Progress Notes (Signed)
Bilateral carotid artery duplex:  1-39% ICA stenosis.  Vertebral artery flow is antegrade.     

## 2013-05-18 ENCOUNTER — Telehealth (HOSPITAL_COMMUNITY): Payer: Self-pay | Admitting: Interventional Radiology

## 2013-05-18 NOTE — Telephone Encounter (Signed)
Spoke to pt told her we would contact her in 6 months for her next US carotid f/u study. Pt states understanding and is complete agreement with this plan.  JM

## 2014-08-17 ENCOUNTER — Other Ambulatory Visit (HOSPITAL_COMMUNITY): Payer: Self-pay | Admitting: Interventional Radiology

## 2014-08-17 DIAGNOSIS — I639 Cerebral infarction, unspecified: Secondary | ICD-10-CM

## 2014-08-17 DIAGNOSIS — I771 Stricture of artery: Secondary | ICD-10-CM

## 2014-08-21 ENCOUNTER — Other Ambulatory Visit: Payer: Self-pay | Admitting: Radiology

## 2014-08-22 ENCOUNTER — Other Ambulatory Visit: Payer: Self-pay | Admitting: Radiology

## 2014-08-25 ENCOUNTER — Ambulatory Visit (HOSPITAL_COMMUNITY): Admission: RE | Admit: 2014-08-25 | Payer: Medicaid Other | Source: Ambulatory Visit

## 2014-11-23 ENCOUNTER — Other Ambulatory Visit: Payer: Self-pay | Admitting: Radiology

## 2014-11-24 ENCOUNTER — Encounter (HOSPITAL_COMMUNITY): Payer: Self-pay

## 2014-11-24 ENCOUNTER — Other Ambulatory Visit (HOSPITAL_COMMUNITY): Payer: Self-pay | Admitting: Interventional Radiology

## 2014-11-24 ENCOUNTER — Ambulatory Visit (HOSPITAL_COMMUNITY)
Admission: RE | Admit: 2014-11-24 | Discharge: 2014-11-24 | Disposition: A | Discharge status: Home / Self Care | Payer: Medicaid Other | Source: Ambulatory Visit | Attending: Interventional Radiology | Admitting: Interventional Radiology

## 2014-11-24 DIAGNOSIS — Z8673 Personal history of transient ischemic attack (TIA), and cerebral infarction without residual deficits: Secondary | ICD-10-CM | POA: Insufficient documentation

## 2014-11-24 DIAGNOSIS — F17210 Nicotine dependence, cigarettes, uncomplicated: Secondary | ICD-10-CM | POA: Insufficient documentation

## 2014-11-24 DIAGNOSIS — K219 Gastro-esophageal reflux disease without esophagitis: Secondary | ICD-10-CM | POA: Diagnosis not present

## 2014-11-24 DIAGNOSIS — Z7902 Long term (current) use of antithrombotics/antiplatelets: Secondary | ICD-10-CM | POA: Diagnosis not present

## 2014-11-24 DIAGNOSIS — M1990 Unspecified osteoarthritis, unspecified site: Secondary | ICD-10-CM | POA: Insufficient documentation

## 2014-11-24 DIAGNOSIS — I10 Essential (primary) hypertension: Secondary | ICD-10-CM | POA: Insufficient documentation

## 2014-11-24 DIAGNOSIS — I771 Stricture of artery: Secondary | ICD-10-CM

## 2014-11-24 DIAGNOSIS — I6523 Occlusion and stenosis of bilateral carotid arteries: Secondary | ICD-10-CM | POA: Insufficient documentation

## 2014-11-24 DIAGNOSIS — Z7982 Long term (current) use of aspirin: Secondary | ICD-10-CM | POA: Diagnosis not present

## 2014-11-24 DIAGNOSIS — E119 Type 2 diabetes mellitus without complications: Secondary | ICD-10-CM | POA: Diagnosis not present

## 2014-11-24 DIAGNOSIS — I639 Cerebral infarction, unspecified: Secondary | ICD-10-CM

## 2014-11-24 DIAGNOSIS — Z79899 Other long term (current) drug therapy: Secondary | ICD-10-CM | POA: Insufficient documentation

## 2014-11-24 LAB — CBC WITH DIFFERENTIAL/PLATELET
Basophils Absolute: 0 10*3/uL (ref 0.0–0.1)
Basophils Relative: 0 % (ref 0–1)
Eosinophils Absolute: 0.1 10*3/uL (ref 0.0–0.7)
Eosinophils Relative: 1 % (ref 0–5)
HEMATOCRIT: 36.5 % (ref 36.0–46.0)
HEMOGLOBIN: 12.5 g/dL (ref 12.0–15.0)
LYMPHS ABS: 1.8 10*3/uL (ref 0.7–4.0)
Lymphocytes Relative: 40 % (ref 12–46)
MCH: 29.1 pg (ref 26.0–34.0)
MCHC: 34.2 g/dL (ref 30.0–36.0)
MCV: 85.1 fL (ref 78.0–100.0)
MONOS PCT: 7 % (ref 3–12)
Monocytes Absolute: 0.3 10*3/uL (ref 0.1–1.0)
NEUTROS ABS: 2.3 10*3/uL (ref 1.7–7.7)
NEUTROS PCT: 52 % (ref 43–77)
Platelets: 201 10*3/uL (ref 150–400)
RBC: 4.29 MIL/uL (ref 3.87–5.11)
RDW: 13.5 % (ref 11.5–15.5)
WBC: 4.4 10*3/uL (ref 4.0–10.5)

## 2014-11-24 LAB — BASIC METABOLIC PANEL
ANION GAP: 7 (ref 5–15)
BUN: 12 mg/dL (ref 6–23)
CALCIUM: 9.5 mg/dL (ref 8.4–10.5)
CO2: 28 mmol/L (ref 19–32)
Chloride: 101 mmol/L (ref 96–112)
Creatinine, Ser: 0.71 mg/dL (ref 0.50–1.10)
GFR calc non Af Amer: >90 mL/min (ref >90)
Glucose, Bld: 109 mg/dL — ABNORMAL HIGH (ref 70–99)
POTASSIUM: 4.2 mmol/L (ref 3.5–5.1)
SODIUM: 136 mmol/L (ref 135–145)

## 2014-11-24 LAB — GLUCOSE, CAPILLARY: Glucose-Capillary: 101 mg/dL — ABNORMAL HIGH (ref 70–99)

## 2014-11-24 LAB — PROTIME-INR
INR: 0.94 (ref 0.00–1.49)
Prothrombin Time: 12.6 seconds (ref 11.6–15.2)

## 2014-11-24 MED ORDER — HEPARIN SODIUM (PORCINE) 1000 UNIT/ML IJ SOLN
INTRAMUSCULAR | Status: AC | PRN
  Administered 2014-11-24: 1000 [IU] via INTRAVENOUS

## 2014-11-24 MED ORDER — MIDAZOLAM HCL 2 MG/2ML IJ SOLN
INTRAMUSCULAR | Status: AC
  Filled 2014-11-24: qty 2

## 2014-11-24 MED ORDER — FENTANYL CITRATE 0.05 MG/ML IJ SOLN
INTRAMUSCULAR | Status: AC
  Filled 2014-11-24: qty 2

## 2014-11-24 MED ORDER — POTASSIUM CHLORIDE IN NACL 20-0.9 MEQ/L-% IV SOLN
INTRAVENOUS | Status: AC
  Filled 2014-11-24: qty 1000

## 2014-11-24 MED ORDER — LIDOCAINE HCL 1 % IJ SOLN
INTRAMUSCULAR | Status: AC
  Filled 2014-11-24: qty 20

## 2014-11-24 MED ORDER — IOHEXOL 300 MG/ML  SOLN
150.0000 mL | Freq: Once | INTRAMUSCULAR | Status: AC | PRN
Start: 2014-11-24 — End: 2014-11-24
  Administered 2014-11-24: 1 mL via INTRA_ARTERIAL

## 2014-11-24 MED ORDER — SODIUM CHLORIDE 0.9 % IV SOLN
INTRAVENOUS | Status: DC
  Administered 2014-11-24: 11:00:00 via INTRAVENOUS

## 2014-11-24 MED ORDER — MIDAZOLAM HCL 2 MG/2ML IJ SOLN
INTRAMUSCULAR | Status: AC | PRN
  Administered 2014-11-24: 1 mg via INTRAVENOUS

## 2014-11-24 MED ORDER — FENTANYL CITRATE 0.05 MG/ML IJ SOLN
INTRAMUSCULAR | Status: AC | PRN
  Administered 2014-11-24: 25 ug via INTRAVENOUS

## 2014-11-24 MED ORDER — HEPARIN SOD (PORK) LOCK FLUSH 100 UNIT/ML IV SOLN
INTRAVENOUS | Status: AC
  Filled 2014-11-24: qty 25

## 2014-11-24 NOTE — Sedation Documentation (Signed)
Exoseal to R groin in place- Bedrest will be 3 hrs- up until 330pm.

## 2014-11-24 NOTE — Sedation Documentation (Signed)
R groin- IR tech going to place exoseal

## 2014-11-24 NOTE — H&P (Signed)
Chief Complaint: CVA 2014 Follow up  Referring Physician(s): Deveshwar,Sanjeev  History of Present Illness: Felicia Chavez is a 54 y.o. female   CVA 2014 Known B ICA stenosis L worse than R Has been on Plavix daily Pt had moved to [place]---unable to keep follow up appts Now living with Dtr in [place] and scheduled for recheck cerebral arteriogram Denies visual or hearing difficulty Does complain of residual Rt arm/leg weakness Continues to smoke daily  Past Medical History  Diagnosis Date  . Hypertension   . Diabetes mellitus without complication   . Stroke   . GERD (gastroesophageal reflux disease)   . Arthritis     Past Surgical History  Procedure Laterality Date  . Tubal ligation    . Radiology with anesthesia N/A 10/18/2012    Procedure: RADIOLOGY WITH ANESTHESIA;  Surgeon: Rob Hickman, MD;  Location: [facility];  Service: Radiology;  Laterality: N/A;    Allergies: Review of patient's allergies indicates no known allergies.  Medications: Prior to Admission medications   Medication Sig Start Date End Date Taking? Authorizing Provider  clopidogrel (PLAVIX) 75 MG tablet Take 1 tablet (75 mg total) by mouth daily with breakfast. 11/04/12  Yes Thurnell Lose, MD  gabapentin (NEURONTIN) 100 MG capsule Take 1 capsule (100 mg total) by mouth 3 (three) times daily. 11/04/12  Yes Thurnell Lose, MD  glipiZIDE (GLUCOTROL XL) 5 MG 24 hr tablet Take 1 tablet (5 mg total) by mouth daily. 11/04/12  Yes Thurnell Lose, MD  glucose monitoring kit (FREESTYLE) monitoring kit 1 each by Does not apply route 4 (four) times daily - after meals and at bedtime. 1 month Diabetic Testing Supplies for QAC-QHS accuchecks. 11/04/12  Yes Thurnell Lose, MD  lisinopril (PRINIVIL,ZESTRIL) 10 MG tablet Take 1 tablet (10 mg total) by mouth daily. 11/04/12  Yes Thurnell Lose, MD  metFORMIN (GLUCOPHAGE) 1000 MG tablet Take 1 tablet (1,000 mg total) by mouth 2 (two) times daily with a  meal. Please start on 09/24/2012 11/04/12  Yes Thurnell Lose, MD  pravastatin (PRAVACHOL) 40 MG tablet Take 1 tablet (40 mg total) by mouth daily. 11/04/12  Yes Thurnell Lose, MD  aspirin EC 81 MG tablet Take 1 tablet (81 mg total) by mouth daily. 11/04/12   Thurnell Lose, MD     History reviewed. No pertinent family history.  History   Social History  . Marital Status: Single    Spouse Name: N/A  . Number of Children: N/A  . Years of Education: N/A   Social History Main Topics  . Smoking status: Current Some Day Smoker -- 0.25 packs/day for 4 years    Types: Cigarettes  . Smokeless tobacco: Never Used  . Alcohol Use: Yes     Comment: rarely  . Drug Use: No  . Sexual Activity: Not on file   Other Topics Concern  . None   Social History Narrative    Review of Systems: A 12 point ROS discussed and pertinent positives are indicated in the HPI above.  All other systems are negative.  Review of Systems  Constitutional: Negative for activity change, appetite change and unexpected weight change.  HENT: Negative for tinnitus.   Eyes: Negative for visual disturbance.  Respiratory: Negative for cough and shortness of breath.   Gastrointestinal: Negative for abdominal pain.  Musculoskeletal: Negative for back pain.  Neurological: Positive for weakness. Negative for dizziness, tremors, seizures, syncope, facial asymmetry, speech difficulty, light-headedness, numbness  and headaches.  Psychiatric/Behavioral: Negative for behavioral problems and confusion.    Vital Signs: BP 127/81 mmHg  Pulse 87  Temp(Src) 98.4 F (36.9 C) (Oral)  Resp 16  Ht 5' 4"  (1.626 m)  Wt 79.833 kg (176 lb)  BMI 30.20 kg/m2  SpO2 100%  Physical Exam  Constitutional: She is oriented to person, place, and time. She appears well-nourished.  Cardiovascular: Normal rate, regular rhythm and normal heart sounds.   No murmur heard. Pulmonary/Chest: Effort normal and breath sounds normal. She has no  wheezes.  Abdominal: Soft. Bowel sounds are normal. There is no tenderness.  Musculoskeletal: Normal range of motion.  Weak on Rt  Neurological: She is alert and oriented to person, place, and time.  Skin: Skin is warm and dry.  Psychiatric: She has a normal mood and affect. Her behavior is normal. Judgment and thought content normal.  Nursing note and vitals reviewed.   Mallampati Score:  MD Evaluation Airway: WNL Heart: WNL Abdomen: WNL Chest/ Lungs: WNL ASA  Classification: 3 Mallampati/Airway Score: Two  Imaging: No results found.  Labs:  CBC:  Recent Labs  11/24/14 1030  WBC 4.4  HGB 12.5  HCT 36.5  PLT 201    COAGS:  Recent Labs  11/24/14 1030  INR 0.94    BMP:  Recent Labs  11/24/14 1030  NA 136  K 4.2  CL 101  CO2 28  GLUCOSE 109*  BUN 12  CALCIUM 9.5  CREATININE 0.71  GFRNONAA >90  GFRAA >90    LIVER FUNCTION TESTS: No results for input(s): BILITOT, AST, ALT, ALKPHOS, PROT, ALBUMIN in the last 8760 hours.  TUMOR MARKERS: No results for input(s): AFPTM, CEA, CA199, CHROMGRNA in the last 8760 hours.  Assessment and Plan:  CVA 2014 L ICA stenosis Moved away---now back in [place] Now for follow up cerebral arteriogram Pt and family aware of procedure benefits and risks including but not limited to Infection;; bleeding; CVA; death; vessel damage Agreeable to proceed Consent signed andin chart  Thank you for this interesting consult.  I greatly enjoyed meeting Felicia Chavez and look forward to participating in their care.  Signed: Yeslin Delio A 11/24/2014, 11:08 AM   I spent a total of  20 Minutes   in face to face in clinical consultation, greater than 50% of which was counseling/coordinating care for cerebral arteriogram

## 2014-11-24 NOTE — Procedures (Signed)
S/p 4 vessel cerebral arteriogram RT CFA approach. Findings . 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 2 Appro 60%stenosis of Rt ICA prox

## 2014-11-24 NOTE — Discharge Instructions (Signed)

## 2015-05-10 ENCOUNTER — Other Ambulatory Visit (HOSPITAL_COMMUNITY): Payer: Self-pay | Admitting: Interventional Radiology

## 2015-05-10 DIAGNOSIS — I771 Stricture of artery: Secondary | ICD-10-CM

## 2015-05-11 ENCOUNTER — Encounter (HOSPITAL_COMMUNITY): Payer: Medicaid Other

## 2015-05-21 ENCOUNTER — Ambulatory Visit (HOSPITAL_COMMUNITY)
Admission: RE | Admit: 2015-05-21 | Discharge: 2015-05-21 | Disposition: A | Discharge status: Home / Self Care | Payer: Medicare Other | Source: Ambulatory Visit | Attending: Interventional Radiology | Admitting: Interventional Radiology

## 2015-05-21 DIAGNOSIS — I6523 Occlusion and stenosis of bilateral carotid arteries: Secondary | ICD-10-CM | POA: Diagnosis not present

## 2015-05-21 DIAGNOSIS — I771 Stricture of artery: Secondary | ICD-10-CM

## 2015-05-21 DIAGNOSIS — Z8673 Personal history of transient ischemic attack (TIA), and cerebral infarction without residual deficits: Secondary | ICD-10-CM | POA: Diagnosis not present

## 2015-05-21 NOTE — Progress Notes (Signed)
Preliminary results by tech - Carotid Duplex Completed. Right ICA 60-79% stenosis - Velocities suggest lower end of scale. Left ICA 1-39% stenosis. Bilateral vertebral demonstrate normal antegrade flow. Marilynne Halsted, BS, RDMS, RVT

## 2015-06-08 ENCOUNTER — Telehealth (HOSPITAL_COMMUNITY): Payer: Self-pay | Admitting: Interventional Radiology

## 2015-06-08 NOTE — Telephone Encounter (Signed)
Tried to call pt's son Maurine Minister(Dennis). Number listed is not his phone number. Called pt's cell phone, no answer & no VM. JM

## 2015-09-25 ENCOUNTER — Emergency Department: Payer: Medicare Other

## 2015-09-25 ENCOUNTER — Emergency Department
Admission: EM | Admit: 2015-09-25 | Discharge: 2015-09-25 | Disposition: A | Discharge status: Home / Self Care | Payer: Medicare Other | Attending: Emergency Medicine | Admitting: Emergency Medicine

## 2015-09-25 ENCOUNTER — Encounter: Payer: Self-pay | Admitting: Medical Oncology

## 2015-09-25 DIAGNOSIS — I10 Essential (primary) hypertension: Secondary | ICD-10-CM | POA: Diagnosis not present

## 2015-09-25 DIAGNOSIS — E119 Type 2 diabetes mellitus without complications: Secondary | ICD-10-CM | POA: Diagnosis not present

## 2015-09-25 DIAGNOSIS — R221 Localized swelling, mass and lump, neck: Secondary | ICD-10-CM | POA: Insufficient documentation

## 2015-09-25 DIAGNOSIS — Z7984 Long term (current) use of oral hypoglycemic drugs: Secondary | ICD-10-CM | POA: Diagnosis not present

## 2015-09-25 DIAGNOSIS — Z79899 Other long term (current) drug therapy: Secondary | ICD-10-CM | POA: Diagnosis not present

## 2015-09-25 DIAGNOSIS — F17210 Nicotine dependence, cigarettes, uncomplicated: Secondary | ICD-10-CM | POA: Diagnosis not present

## 2015-09-25 DIAGNOSIS — R220 Localized swelling, mass and lump, head: Secondary | ICD-10-CM | POA: Diagnosis present

## 2015-09-25 DIAGNOSIS — Z7982 Long term (current) use of aspirin: Secondary | ICD-10-CM | POA: Diagnosis not present

## 2015-09-25 DIAGNOSIS — L03211 Cellulitis of face: Secondary | ICD-10-CM

## 2015-09-25 LAB — CBC WITH DIFFERENTIAL/PLATELET
Basophils Absolute: 0 10*3/uL (ref 0–0.1)
Basophils Relative: 0 %
Eosinophils Absolute: 0.2 10*3/uL (ref 0–0.7)
Eosinophils Relative: 3 %
HCT: 35.5 % (ref 35.0–47.0)
HEMOGLOBIN: 12.2 g/dL (ref 12.0–16.0)
LYMPHS ABS: 1.4 10*3/uL (ref 1.0–3.6)
LYMPHS PCT: 27 %
MCH: 29.1 pg (ref 26.0–34.0)
MCHC: 34.5 g/dL (ref 32.0–36.0)
MCV: 84.2 fL (ref 80.0–100.0)
Monocytes Absolute: 0.5 10*3/uL (ref 0.2–0.9)
Monocytes Relative: 9 %
NEUTROS PCT: 61 %
Neutro Abs: 3.1 10*3/uL (ref 1.4–6.5)
Platelets: 181 10*3/uL (ref 150–440)
RBC: 4.21 MIL/uL (ref 3.80–5.20)
RDW: 13.7 % (ref 11.5–14.5)
WBC: 5.1 10*3/uL (ref 3.6–11.0)

## 2015-09-25 LAB — BASIC METABOLIC PANEL
Anion gap: 11 (ref 5–15)
BUN: 11 mg/dL (ref 6–20)
CHLORIDE: 103 mmol/L (ref 101–111)
CO2: 25 mmol/L (ref 22–32)
Calcium: 9.4 mg/dL (ref 8.9–10.3)
Creatinine, Ser: 0.97 mg/dL (ref 0.44–1.00)
GFR calc Af Amer: >60 mL/min (ref >60)
GLUCOSE: 135 mg/dL — AB (ref 65–99)
POTASSIUM: 3.9 mmol/L (ref 3.5–5.1)
Sodium: 139 mmol/L (ref 135–145)

## 2015-09-25 MED ORDER — METHYLPREDNISOLONE SODIUM SUCC 125 MG IJ SOLR
125.0000 mg | Freq: Once | INTRAMUSCULAR | Status: AC
  Administered 2015-09-25: 125 mg via INTRAVENOUS
  Filled 2015-09-25: qty 2

## 2015-09-25 MED ORDER — IOHEXOL 300 MG/ML  SOLN
75.0000 mL | Freq: Once | INTRAMUSCULAR | Status: AC | PRN
Start: 2015-09-25 — End: 2015-09-25
  Administered 2015-09-25: 75 mL via INTRAVENOUS

## 2015-09-25 MED ORDER — CEPHALEXIN 500 MG PO CAPS: 500.0000 mg | ORAL_CAPSULE | Freq: Four times a day (QID) | ORAL | Status: AC

## 2015-09-25 NOTE — ED Notes (Signed)
Pt started having facial swelling last night, pt has taken a few doses of benadryl last night, swelling has increased today, pt denies difficulty breathing , pt is in no resiratory distress

## 2015-09-25 NOTE — ED Notes (Signed)
Pt began yesterday with facial swelling and now swelling has worsened and pt is having swelling in her neck. No sob, no throat swelling at the present. Pt denies allergies. Takes lisinopril.

## 2015-09-25 NOTE — ED Notes (Signed)

## 2015-09-25 NOTE — Discharge Instructions (Signed)
Please seek medical attention for any high fevers, chest pain, shortness of breath, change in behavior, persistent vomiting, bloody stool or any other new or concerning symptoms. ° ° °Cellulitis °Cellulitis is an infection of the skin and the tissue beneath it. The infected area is usually red and tender. Cellulitis occurs most often in the arms and lower legs.  °CAUSES  °Cellulitis is caused by bacteria that enter the skin through cracks or cuts in the skin. The most common types of bacteria that cause cellulitis are staphylococci and streptococci. °SIGNS AND SYMPTOMS  °· Redness and warmth. °· Swelling. °· Tenderness or pain. °· Fever. °DIAGNOSIS  °Your health care provider can usually determine what is wrong based on a physical exam. Blood tests may also be done. °TREATMENT  °Treatment usually involves taking an antibiotic medicine. °HOME CARE INSTRUCTIONS  °· Take your antibiotic medicine as directed by your health care provider. Finish the antibiotic even if you start to feel better. °· Keep the infected arm or leg elevated to reduce swelling. °· Apply a warm cloth to the affected area up to 4 times per day to relieve pain. °· Take medicines only as directed by your health care provider. °· Keep all follow-up visits as directed by your health care provider. °SEEK MEDICAL CARE IF:  °· You notice red streaks coming from the infected area. °· Your red area gets larger or turns dark in color. °· Your bone or joint underneath the infected area becomes painful after the skin has healed. °· Your infection returns in the same area or another area. °· You notice a swollen bump in the infected area. °· You develop new symptoms. °· You have a fever. °SEEK IMMEDIATE MEDICAL CARE IF:  °· You feel very sleepy. °· You develop vomiting or diarrhea. °· You have a general ill feeling (malaise) with muscle aches and pains. °  °This information is not intended to replace advice given to you by your health care provider. Make sure  you discuss any questions you have with your health care provider. °  °Document Released: 05/14/2005 Document Revised: 04/25/2015 Document Reviewed: 10/20/2011 °Elsevier Interactive Patient Education ©2016 [employer] ° °

## 2015-09-25 NOTE — ED Provider Notes (Addendum)
[facility] Emergency Department Provider Note    ____________________________________________  Time seen: ~1715  I have reviewed the triage vital signs and the nursing notes.   HISTORY  Chief Complaint Facial Swelling   History limited by: Not Limited   HPI Felicia Chavez is a 55 y.o. female who presents to the emergency department today because of concerns for facial swelling and right neck swelling. The patient states that the symptoms started yesterday. She did not notice any swelling in her tongue or lips. She states that she tried taking some Benadryl yesterday and this morning without any relief. She has had some associated itchiness but no pain. She states today she noticed more swelling on the right neck. She feels like a knot has come up there. She denies any difficulty with breathing. Denies any difficulty with swallowing food or liquid today. She denies any fevers. Denies similar symptoms in the past. She states she is on lisinopril for blood pressure.     Past Medical History  Diagnosis Date  . Hypertension   . Diabetes mellitus without complication (HCC)   . Stroke (HCC)   . GERD (gastroesophageal reflux disease)   . Arthritis     Patient Active Problem List   Diagnosis Date Noted  . Stenosis of artery (HCC)   . Stroke (HCC)   . HTN (hypertension) 09/20/2012  . Diabetes (HCC) 09/20/2012  . HLD (hyperlipidemia) 09/20/2012  . H/O: stroke with residual effects 09/20/2012  . Facial droop 09/20/2012    Past Surgical History  Procedure Laterality Date  . Tubal ligation    . Radiology with anesthesia N/A 10/18/2012    Procedure: RADIOLOGY WITH ANESTHESIA;  Surgeon: Sanjeev K Deveshwar, MD;  Location: [facility] OR;  Service: Radiology;  Laterality: N/A;    Current Outpatient Rx  Name  Route  Sig  Dispense  Refill  . aspirin EC 81 MG tablet   Oral   Take 1 tablet (81 mg total) by mouth daily.   30 tablet   2   . clopidogrel (PLAVIX) 75  MG tablet   Oral   Take 1 tablet (75 mg total) by mouth daily with breakfast.   30 tablet   2   . gabapentin (NEURONTIN) 100 MG capsule   Oral   Take 1 capsule (100 mg total) by mouth 3 (three) times daily.   90 capsule   2   . glipiZIDE (GLUCOTROL XL) 5 MG 24 hr tablet   Oral   Take 1 tablet (5 mg total) by mouth daily.   30 tablet   2   . glucose monitoring kit (FREESTYLE) monitoring kit   Does not apply   1 each by Does not apply route 4 (four) times daily - after meals and at bedtime. 1 month Diabetic Testing Supplies for QAC-QHS accuchecks.   1 each   1   . lisinopril (PRINIVIL,ZESTRIL) 10 MG tablet   Oral   Take 1 tablet (10 mg total) by mouth daily.   30 tablet   2   . metFORMIN (GLUCOPHAGE) 1000 MG tablet   Oral   Take 1 tablet (1,000 mg total) by mouth 2 (two) times daily with a meal. Please start on 09/24/2012   60 tablet   2   . pravastatin (PRAVACHOL) 40 MG tablet   Oral   Take 1 tablet (40 mg total) by mouth daily.   30 tablet   2     Allergies Review of patient's allergies indicates no   known allergies.  No family history on file.  Social History Social History  Substance Use Topics  . Smoking status: Current Some Day Smoker -- 0.25 packs/day for 4 years    Types: Cigarettes  . Smokeless tobacco: Never Used  . Alcohol Use: Yes     Comment: rarely    Review of Systems  Constitutional: Negative for fever. Cardiovascular: Negative for chest pain. Respiratory: Negative for shortness of breath. Gastrointestinal: Negative for abdominal pain, vomiting and diarrhea. Skin: Negative for rash. Neurological: Negative for headaches, focal weakness or numbness.   10-point ROS otherwise negative.  ____________________________________________   PHYSICAL EXAM:  VITAL SIGNS: ED Triage Vitals  Enc Vitals Group     BP 09/25/15 1711 150/82 mmHg     Pulse Rate 09/25/15 1711 111     Resp 09/25/15 1711 20     Temp 09/25/15 1711 98.9 F (37.2 C)      Temp Source 09/25/15 1711 Oral     SpO2 09/25/15 1711 98 %     Weight 09/25/15 1711 178 lb (80.74 kg)     Height 09/25/15 1711 5' 2" (1.575 m)   Constitutional: Alert and oriented. Well appearing and in no distress. Eyes: Conjunctivae are normal. PERRL. Normal extraocular movements. ENT   Head: Normocephalic and atraumatic.   Nose: No congestion/rhinnorhea.   Mouth/Throat: Mucous membranes are moist. No appreciated tongue or lip swelling.   Neck: No stridor. Slightly tender and swollen right neck. Hematological/Lymphatic/Immunilogical: No cervical lymphadenopathy. Cardiovascular: Normal rate, regular rhythm.  No murmurs, rubs, or gallops. Respiratory: Normal respiratory effort without tachypnea nor retractions. Breath sounds are clear and equal bilaterally. No wheezes/rales/rhonchi. Gastrointestinal: Soft and nontender. No distention. There is no CVA tenderness. Genitourinary: Deferred Musculoskeletal: Normal range of motion in all extremities. No joint effusions.  No lower extremity tenderness nor edema. Neurologic:  Normal speech and language. No gross focal neurologic deficits are appreciated.  Skin:  Skin is warm, dry and intact. No rash noted. Psychiatric: Mood and affect are normal. Speech and behavior are normal. Patient exhibits appropriate insight and judgment.  ____________________________________________    LABS (pertinent positives/negatives)  Labs Reviewed  BASIC METABOLIC PANEL - Abnormal; Notable for the following:    Glucose, Bld 135 (*)    All other components within normal limits  CBC WITH DIFFERENTIAL/PLATELET     ____________________________________________   EKG  None  ____________________________________________    RADIOLOGY  CT soft tissue neck IMPRESSION: 1. Mild-to-moderate subcutaneous inflammatory change throughout the face and upper neck, relatively symmetric although slightly greater in the left lower face. This is  suggestive of cellulitis, without a definite salivary or dental source identified. No abscess. 2. 10 mm irregular pulmonary opacity in the left upper lobe. While this may represent a focal area of inflammation or scarring, bronchogenic neoplasm is also a consideration (particularly given the patient's history of smoking) and a follow-up CT is recommended in 3 months.  ____________________________________________   PROCEDURES  Procedure(s) performed: None  Critical Care performed: No  ____________________________________________   INITIAL IMPRESSION / ASSESSMENT AND PLAN / ED COURSE  Pertinent labs & imaging results that were available during my care of the patient were reviewed by me and considered in my medical decision making (see chart for details).  Patient presented to the emergency department today because of concern for facial swelling. Patient is on lisinopril. The patient however did not have the classic mucosal swelling. A CT soft tissue neck was obtained which was more concerning for cellulitis. Will plan   on placing on antibiotics. Patient stated she could follow up with PCP in 1-2 days.  ----------------------------------------- 5:28 PM on 09/26/2015 -----------------------------------------  I did call and contacted the patient today. I discussed with her the finding of the nodule on the CT scan. Also discussed the need for a follow-up CT scan in 3 months. I discussed that her primary care physician can help arrange this. Furthermore I discussed this with her daughter at the patient's request.  ____________________________________________   FINAL CLINICAL IMPRESSION(S) / ED DIAGNOSES  Final diagnoses:  Cellulitis of face     Graydon Goodman, MD 09/26/15 1533  Graydon Goodman, MD 09/26/15 1729 

## 2015-10-11 ENCOUNTER — Telehealth (HOSPITAL_COMMUNITY): Payer: Self-pay

## 2015-10-11 NOTE — Telephone Encounter (Signed)
Called to schedule f/u US Carotid with Dr. Corliss Skains, left VM for pt to call back. AW

## 2015-10-12 ENCOUNTER — Institutional Professional Consult (permissible substitution): Payer: Medicaid Other | Admitting: Internal Medicine

## 2015-10-23 ENCOUNTER — Other Ambulatory Visit (HOSPITAL_COMMUNITY): Payer: Self-pay | Admitting: Interventional Radiology

## 2015-10-23 DIAGNOSIS — I771 Stricture of artery: Secondary | ICD-10-CM

## 2015-10-29 ENCOUNTER — Ambulatory Visit (HOSPITAL_COMMUNITY)
Admission: RE | Admit: 2015-10-29 | Discharge: 2015-10-29 | Disposition: A | Discharge status: Home / Self Care | Payer: Medicare Other | Source: Ambulatory Visit | Attending: Interventional Radiology | Admitting: Interventional Radiology

## 2015-10-29 DIAGNOSIS — I771 Stricture of artery: Secondary | ICD-10-CM

## 2015-10-29 DIAGNOSIS — I6523 Occlusion and stenosis of bilateral carotid arteries: Secondary | ICD-10-CM | POA: Diagnosis not present

## 2015-10-29 NOTE — Progress Notes (Signed)
VASCULAR LAB PRELIMINARY  PRELIMINARY  PRELIMINARY  PRELIMINARY  Carotid duplex completed.    Preliminary report:  Right:  60-79% internal carotid artery stenosis.  Left: 1% to 39% ICA stenosis. Bilateral vertebral artery flow was antegrade. No significant change since study of 05/2015.  Taqwa Deem, RVS 10/29/2015, 9:48 AM

## 2015-11-06 ENCOUNTER — Telehealth (HOSPITAL_COMMUNITY): Payer: Self-pay

## 2015-11-06 NOTE — Telephone Encounter (Signed)
Spoke with pt's son. He agreed to have the pt f/u in 6 months with a US Carotid per Dr. Corliss Skainseveshwar. AW

## 2015-11-27 ENCOUNTER — Encounter: Payer: Self-pay | Admitting: *Deleted

## 2015-11-27 ENCOUNTER — Institutional Professional Consult (permissible substitution): Payer: Medicaid Other | Admitting: Internal Medicine

## 2015-12-18 ENCOUNTER — Ambulatory Visit (INDEPENDENT_AMBULATORY_CARE_PROVIDER_SITE_OTHER): Payer: Medicare Other | Admitting: Internal Medicine

## 2015-12-18 ENCOUNTER — Encounter: Payer: Self-pay | Admitting: Internal Medicine

## 2015-12-18 VITALS — BP 144/90 | HR 109 | Ht 62.0 in | Wt 191.0 lb

## 2015-12-18 DIAGNOSIS — Z720 Tobacco use: Secondary | ICD-10-CM | POA: Diagnosis not present

## 2015-12-18 DIAGNOSIS — R911 Solitary pulmonary nodule: Secondary | ICD-10-CM | POA: Diagnosis not present

## 2015-12-18 NOTE — Patient Instructions (Addendum)
Follow up with Dr. Dema SeverinMungal in:2 months - CT Chest without contrast for pulmonary nodule in LUL - PFTs, 6mwt prior to follow up visit - cut back and stop smoking eventually.

## 2015-12-18 NOTE — Assessment & Plan Note (Addendum)
10 mm left upper lobe irregular shaped pulmonary nodule incidentally found. Most likely an inflammatory/reactive nodule given the recent episode of shingles. Patient with no episodes of hemoptysis, night sweats, fevers, cough, significant weight loss. She does have a history of smoking, and given the shape of the nodule, further surveillance CT is warranted  Plan for repeat CT chest for further evaluation. If pulmonary nodule is still prominent, will proceed with further workup to include bronchoscopy with biopsy. Pulmonary function testing and 6 minute walk test prior to follow-up visit The above was discussed with patient and her son, who were in agreement.

## 2015-12-18 NOTE — Assessment & Plan Note (Signed)
Tobacco Cessation - Counseling regarding benefits of smoking cessation strategies was provided for more than 12 min. - Educated that at this time smoking- cessation represents the single most important step that patient can take to enhance the length and quality of live. - Educated patient regarding alternatives of behavior interventions, pharmacotherapy including NRT and non-nicotine therapy such, and combinations of both. - Patient at this time: will try to quit on her own 

## 2015-12-18 NOTE — Progress Notes (Signed)
[facility] Consultation    Date: 12/18/2015  MRN# [id number] NYSIA Chavez 1960-09-26  Referring Physician: [facility] PMD - Self Referral Felicia Chavez is a 55 y.o. old female seen in consultation for lung nodule  CC:  Chief Complaint  Patient presents with  . pulmonary consult    self ref. pt states he was recently @ ED for shingles had CT scan that showed lung nodules. denies cough, sob, wheezing or cp/tightness.     HPI:  She is a 55 year old female past medical history of tobacco abuse, CVA on Plavix, diabetes, hypertension, hyperlipidemia, seen in consultation for incidental lung nodule. In February 2017 patient had an outbreak of shingles along the medial aspect of her neck down to her chest, that time she was taken to the ED for further evaluation of the rash, noted to be shingles as stated, however CT scan of the neck did obtain some lung windows that showed a 10 mm irregular pulmonary opacity in the left upper lobe; this was correlating on the same side as her shingle outbreak. She is a current smoker, 6 cigarettes a day for the last year down from a pack a day 4 years. She currently drinks 2 beers per day, previously drank four 40oz per day.  She denies any hemoptysis, night sweats, significant weight loss, fever, chills, shortness of breath, cough. Today she is accompanied by her son. She has a history of stroke currently on Plavix, only neurological deficit is right hand weakness. Given the location of the left upper lobe pulmonary opacity and history of smoking, we discussed further workup which included a dedicated chest CT which patient and her son is in agreement to. It is been about 3 months since this is been followed up in [facility] is appropriate at this time.  PMHX:   Past Medical History  Diagnosis Date  . Hypertension   . Diabetes mellitus without complication ([place])   . Stroke ([place])   . GERD (gastroesophageal reflux  disease)   . Arthritis    Surgical Hx:  Past Surgical History  Procedure Laterality Date  . Tubal ligation    . Radiology with anesthesia N/A 10/18/2012    Procedure: RADIOLOGY WITH ANESTHESIA;  Surgeon: Rob Hickman, MD;  Location: [place];  Service: Radiology;  Laterality: N/A;   Family Hx:  Family History  Problem Relation Age of Onset  . Hypertension Mother    Social Hx:   Social History  Substance Use Topics  . Smoking status: Current Every Day Smoker -- 0.25 packs/day for 4 years    Types: Cigarettes  . Smokeless tobacco: Never Used  . Alcohol Use: Yes     Comment: rarely   Medication:   Current Outpatient Rx  Name  Route  Sig  Dispense  Refill  . aspirin EC 81 MG tablet   Oral   Take 1 tablet (81 mg total) by mouth daily.   30 tablet   2   . clopidogrel (PLAVIX) 75 MG tablet   Oral   Take 1 tablet (75 mg total) by mouth daily with breakfast.   30 tablet   2   . gabapentin (NEURONTIN) 100 MG capsule   Oral   Take 1 capsule (100 mg total) by mouth 3 (three) times daily.   90 capsule   2   . glipiZIDE (GLUCOTROL XL) 5 MG 24 hr tablet   Oral   Take 1 tablet (5 mg total) by mouth daily.  30 tablet   2   . glucose monitoring kit (FREESTYLE) monitoring kit   Does not apply   1 each by Does not apply route 4 (four) times daily - after meals and at bedtime. 1 month Diabetic Testing Supplies for QAC-QHS accuchecks.   1 each   1   . hydrochlorothiazide (HYDRODIURIL) 25 MG tablet   Oral   Take 25 mg by mouth daily.         . metFORMIN (GLUCOPHAGE) 1000 MG tablet   Oral   Take 1 tablet (1,000 mg total) by mouth 2 (two) times daily with a meal. Please start on 09/24/2012   60 tablet   2   . pravastatin (PRAVACHOL) 40 MG tablet   Oral   Take 1 tablet (40 mg total) by mouth daily.   30 tablet   2       Allergies:  Review of patient's allergies indicates no known allergies.  Review of Systems  Constitutional: Negative for fever.  HENT:  Negative for hearing loss.   Eyes: Negative for blurred vision and double vision.  Respiratory: Negative for cough, hemoptysis, sputum production and shortness of breath.   Cardiovascular: Negative for chest pain.  Gastrointestinal: Negative for heartburn and nausea.  Genitourinary: Negative for dysuria.  Musculoskeletal: Negative for myalgias.  Skin: Negative for itching and rash.  Neurological: Negative for dizziness and headaches.  Endo/Heme/Allergies: Does not bruise/bleed easily.  Psychiatric/Behavioral: Negative for depression.     Physical Examination:   VS: BP 144/90 mmHg  Pulse 109  Ht 5' 2"  (1.575 m)  Wt 191 lb (86.637 kg)  BMI 34.93 kg/m2  SpO2 99%  General Appearance: No distress  Neuro:without focal findings, mental status, speech normal, alert and oriented, cranial nerves 2-12 intact, reflexes normal and symmetric, sensation grossly normal  HEENT: PERRLA, EOM intact, no ptosis, no other lesions noticed; Mallampati3 Pulmonary: normal breath sounds., diaphragmatic excursion normal.No wheezing, No rales;   Sputum Production:  none CardiovascularNormal S1,S2.  No m/r/g.  Abdominal aorta pulsation normal.    Abdomen: Benign, Soft, non-tender, No masses, hepatosplenomegaly, No lymphadenopathy Renal:  No costovertebral tenderness  GU:  No performed at this time. Endoc: No evident thyromegaly, no signs of acromegaly or Cushing features Skin:   warm, no rashes, no ecchymosis. Dried up lesion on left lateral neck extending down to the chest Extremities: normal, no cyanosis, clubbing, no edema, warm with normal capillary refill. Other findings:none   Labs results:   Rad results: (The following images and results were reviewed by Dr. Stevenson Clinch on 12/18/2015). CT Neck 09/2015 CT NECK WITH CONTRAST  TECHNIQUE: Multidetector CT imaging of the neck was performed using the standard protocol following the bolus administration of intravenous contrast.  CONTRAST: 29m OMNIPAQUE  IOHEXOL 300 MG/ML SOLN  COMPARISON: None.  FINDINGS: Pharynx and larynx: The nasopharynx, oropharynx, oral cavity, hypopharynx, and larynx are unremarkable.  Salivary glands: There is diffuse infiltration of the subcutaneous fat of the face and upper neck which is overall relatively symmetric although slightly greater on the left in the lower facial region. There is thickening of the platysma bilaterally. Inflammation is present superficial to both parotid glands, however the glands themselves are not definitely abnormal. The submandibular glands are unremarkable. No organized fluid collection/abscess is seen. No definite dental origin of the inflammation is identified.  Thyroid: Unremarkable.  Lymph nodes: Mildly increased number of subcentimeter lymph nodes throughout the neck, likely reactive. Level V lymph nodes measure up to 9 mm in short axis on  the right and 8 mm on the left. Left level II lymph nodes measure up to 8 mm.  Vascular: Major vascular structures of the neck appear patent.  Limited intracranial: Visualized portion of the brain is unremarkable.  Visualized orbits: Unremarkable.  Mastoids and visualized paranasal sinuses: Small left mastoid effusion. Visualized paranasal sinuses clear.  Skeleton: No acute osseous abnormality identified.  Upper chest: 10 mm irregular opacity in the left upper lobe (series 4, image 35).  IMPRESSION: 1. Mild-to-moderate subcutaneous inflammatory change throughout the face and upper neck, relatively symmetric although slightly greater in the left lower face. This is suggestive of cellulitis, without a definite salivary or dental source identified. No abscess. 2. 10 mm irregular pulmonary opacity in the left upper lobe. While this may represent a focal area of inflammation or scarring, bronchogenic neoplasm is also a consideration (particularly given the patient's history of smoking) and a follow-up CT is  recommended in 3 months.    Assessment and Plan:55 year old female past medical history of tobacco abuse, diabetes, CVA on Plavix, seen in consultation for 10 mm irregular shaped left upper lobe nodule Pulmonary nodule 10 mm left upper lobe irregular shaped pulmonary nodule incidentally found. Most likely an inflammatory/reactive nodule given the recent episode of shingles. Patient with no episodes of hemoptysis, night sweats, fevers, cough, significant weight loss. She does have a history of smoking, and given the shape of the nodule, further surveillance CT is warranted  Plan for repeat CT chest for further evaluation. If pulmonary nodule is still prominent, will proceed with further workup to include bronchoscopy with biopsy. Pulmonary function testing and 6 minute walk test prior to follow-up visit The above was discussed with patient and her son, who were in agreement.  Tobacco abuse Tobacco Cessation - Counseling regarding benefits of smoking cessation strategies was provided for more than 12 min. - Educated that at this time smoking- cessation represents the single most important step that patient can take to enhance the length and quality of live. - Educated patient regarding alternatives of behavior interventions, pharmacotherapy including NRT and non-nicotine therapy such, and combinations of both. - Patient at this time:will try to quit on her own.      Updated Medication List Outpatient Encounter Prescriptions as of 12/18/2015  Medication Sig  . aspirin EC 81 MG tablet Take 1 tablet (81 mg total) by mouth daily.  . clopidogrel (PLAVIX) 75 MG tablet Take 1 tablet (75 mg total) by mouth daily with breakfast.  . gabapentin (NEURONTIN) 100 MG capsule Take 1 capsule (100 mg total) by mouth 3 (three) times daily.  Marland Kitchen glipiZIDE (GLUCOTROL XL) 5 MG 24 hr tablet Take 1 tablet (5 mg total) by mouth daily.  Marland Kitchen glucose monitoring kit (FREESTYLE) monitoring kit 1 each by Does not apply  route 4 (four) times daily - after meals and at bedtime. 1 month Diabetic Testing Supplies for QAC-QHS accuchecks.  . hydrochlorothiazide (HYDRODIURIL) 25 MG tablet Take 25 mg by mouth daily.  . metFORMIN (GLUCOPHAGE) 1000 MG tablet Take 1 tablet (1,000 mg total) by mouth 2 (two) times daily with a meal. Please start on 09/24/2012  . pravastatin (PRAVACHOL) 40 MG tablet Take 1 tablet (40 mg total) by mouth daily.  . [DISCONTINUED] lisinopril (PRINIVIL,ZESTRIL) 10 MG tablet Take 1 tablet (10 mg total) by mouth daily. (Patient not taking: Reported on 12/18/2015)   No facility-administered encounter medications on file as of 12/18/2015.    Orders for this visit: Orders Placed This Encounter  Procedures  .  CT Chest Wo Contrast    Standing Status: Future     Number of Occurrences:      Standing Expiration Date: 02/16/2017    Order Specific Question:  Reason for Exam (SYMPTOM  OR DIAGNOSIS REQUIRED)    Answer:  pulm nodule LUL    Order Specific Question:  Is the patient pregnant?    Answer:  No    Order Specific Question:  Preferred imaging location?    Answer:  [facility]  . Pulmonary function test    Standing Status: Future     Number of Occurrences:      Standing Expiration Date: 12/17/2016    Order Specific Question:  Where should this test be performed?    Answer:  [facility]    Order Specific Question:  6 minute walk    Answer:  Yes    Order Specific Question:  Diffusion capacity (DLCO)    Answer:  Yes    Order Specific Question:  Lung volumes    Answer:  Yes  . 6 minute walk    Standing Status: Future     Number of Occurrences:      Standing Expiration Date: 12/17/2016     Thank  you for the consultation and for allowing [facility] to assist in the care of your patient. Our recommendations are noted above.  Please contact us if we can be of further service.   Vilinda Boehringer, MD [facility] Office Number: [phone]  Note: This note was prepared with Dragon dictation along with smaller phrase technology. Any transcriptional errors that result from this process are unintentional.

## 2016-01-07 ENCOUNTER — Ambulatory Visit: Payer: Medicare Other

## 2016-01-15 ENCOUNTER — Ambulatory Visit
Admission: RE | Admit: 2016-01-15 | Discharge: 2016-01-15 | Disposition: A | Discharge status: Home / Self Care | Payer: Medicare Other | Source: Ambulatory Visit | Attending: Internal Medicine | Admitting: Internal Medicine

## 2016-01-15 DIAGNOSIS — R911 Solitary pulmonary nodule: Secondary | ICD-10-CM | POA: Diagnosis present

## 2016-01-15 DIAGNOSIS — R918 Other nonspecific abnormal finding of lung field: Secondary | ICD-10-CM | POA: Insufficient documentation

## 2016-01-15 DIAGNOSIS — R932 Abnormal findings on diagnostic imaging of liver and biliary tract: Secondary | ICD-10-CM | POA: Diagnosis not present

## 2016-01-17 ENCOUNTER — Telehealth: Payer: Self-pay | Admitting: *Deleted

## 2016-01-17 NOTE — Telephone Encounter (Signed)
Pt unavailable and spoke with Mr. Motley whom is on DPR and informed him of the CT results per VM. Nothing further needed.

## 2016-01-17 NOTE — Telephone Encounter (Signed)
-----   Message from Stephanie AcreVishal Mungal, MD sent at 01/15/2016  4:54 PM EDT ----- Regarding: CT Chest results Please inform patient that her CT chest results still show a LUL nodule, further workup and discussion at follow up visit.   Thank you VM

## 2016-02-21 ENCOUNTER — Ambulatory Visit: Payer: Medicare Other

## 2016-02-26 ENCOUNTER — Ambulatory Visit: Payer: Medicare Other | Admitting: Internal Medicine

## 2016-03-25 ENCOUNTER — Ambulatory Visit: Payer: Medicare Other

## 2016-03-28 ENCOUNTER — Ambulatory Visit: Payer: Medicare Other | Admitting: Internal Medicine

## 2016-04-01 ENCOUNTER — Ambulatory Visit (INDEPENDENT_AMBULATORY_CARE_PROVIDER_SITE_OTHER): Payer: Medicare Other | Admitting: *Deleted

## 2016-04-01 DIAGNOSIS — R911 Solitary pulmonary nodule: Secondary | ICD-10-CM

## 2016-04-01 LAB — PULMONARY FUNCTION TEST
DL/VA % pred: 104 %
DL/VA: 4.75 ml/min/mmHg/L
DLCO unc % pred: 111 %
DLCO unc: 24.07 ml/min/mmHg
FEF 25-75 POST: 2.93 L/s
FEF 25-75 Pre: 2.49 L/sec
FEF2575-%Change-Post: 17 %
FEF2575-%PRED-PRE: 116 %
FEF2575-%Pred-Post: 136 %
FEV1-%Change-Post: 3 %
FEV1-%PRED-POST: 110 %
FEV1-%Pred-Pre: 107 %
FEV1-POST: 2.27 L
FEV1-Pre: 2.19 L
FEV1FVC-%Change-Post: 3 %
FEV1FVC-%Pred-Pre: 102 %
FEV6-%CHANGE-POST: 0 %
FEV6-%PRED-POST: 105 %
FEV6-%PRED-PRE: 106 %
FEV6-POST: 2.63 L
FEV6-PRE: 2.64 L
FEV6FVC-%PRED-POST: 103 %
FEV6FVC-%PRED-PRE: 103 %
FVC-%CHANGE-POST: 0 %
FVC-%Pred-Post: 102 %
FVC-%Pred-Pre: 102 %
FVC-Post: 2.63 L
FVC-Pre: 2.64 L
POST FEV6/FVC RATIO: 100 %
PRE FEV1/FVC RATIO: 83 %
Post FEV1/FVC ratio: 86 %
Pre FEV6/FVC Ratio: 100 %

## 2016-04-01 NOTE — Progress Notes (Signed)
PFT performed today with nitrogen washout. 

## 2016-04-01 NOTE — Progress Notes (Signed)
SMW performed today. 

## 2016-04-14 ENCOUNTER — Ambulatory Visit: Payer: Medicare Other | Admitting: Internal Medicine

## 2016-04-15 ENCOUNTER — Ambulatory Visit (INDEPENDENT_AMBULATORY_CARE_PROVIDER_SITE_OTHER): Payer: Medicare Other | Admitting: Internal Medicine

## 2016-04-15 ENCOUNTER — Encounter: Payer: Self-pay | Admitting: Internal Medicine

## 2016-04-15 VITALS — BP 128/72 | HR 97 | Ht 62.0 in | Wt 183.4 lb

## 2016-04-15 DIAGNOSIS — R911 Solitary pulmonary nodule: Secondary | ICD-10-CM

## 2016-04-15 DIAGNOSIS — Z720 Tobacco use: Secondary | ICD-10-CM

## 2016-04-15 DIAGNOSIS — F17210 Nicotine dependence, cigarettes, uncomplicated: Secondary | ICD-10-CM | POA: Insufficient documentation

## 2016-04-15 NOTE — Patient Instructions (Addendum)
Follow up with Dr. Dema SeverinMungal in:2 months -CT chest without contrast prior to follow-up visit -please cut back on smoking and eventually quit.

## 2016-04-15 NOTE — Assessment & Plan Note (Signed)
Patient with 12 x 9 x 9 mm speaking opacities in the left upper lobe, also has an irregular opacity within the posterior medial left lower lobe drink 10 mm. Reviewed CT shows that these are frontal in size depending on sagittal and coronal images. Overall suspected to be a postinfectious process, however this CT scan is from May 2017. Patient required another CT scan to follow up these nodules prior to follow-up visit. She is advised to continue with reduction of smoking and avoidance of any type of secondhand smoke.  Plan: -CT chest without contrast prior to follow-up visit

## 2016-04-15 NOTE — Progress Notes (Signed)
[facility] Consultation      MRN# [id number] Felicia Chavez 05-04-1961   CC: Chief Complaint  Patient presents with  . Follow-up    review 29m and PFT.  pt denies any breathing complaints today.        Brief History: 55yo F current smoker,6 cigs per day, seen for pulmonary nodule, 1 cm irregular coronary opacity in the left upper lobe, after a bout of shingles.    Events since last clinic visit: Patient presents today for follow-up visit of her CT scan, pulmonary function testing and 6 minute walk test. Since her last visit she continues to smoke 6 cigarettes per day. She was last seen for pulmonary nodule after a bout of shingles, her nodules in the left upper lobe. Today we reviewed his CT scan, along with PFTs and 6 minute walk test. She still endorses mild shortness of breath with climbing [place] 2 flights of stairs and a mild intermittent cough that described as a tickle in the throat. Today she is accompanied by her son.    Current Outpatient Prescriptions:  .  aspirin EC 81 MG tablet, Take 1 tablet (81 mg total) by mouth daily., Disp: 30 tablet, Rfl: 2 .  clopidogrel (PLAVIX) 75 MG tablet, Take 1 tablet (75 mg total) by mouth daily with breakfast., Disp: 30 tablet, Rfl: 2 .  gabapentin (NEURONTIN) 100 MG capsule, Take 1 capsule (100 mg total) by mouth 3 (three) times daily., Disp: 90 capsule, Rfl: 2 .  glipiZIDE (GLUCOTROL XL) 5 MG 24 hr tablet, Take 1 tablet (5 mg total) by mouth daily., Disp: 30 tablet, Rfl: 2 .  glucose monitoring kit (FREESTYLE) monitoring kit, 1 each by Does not apply route 4 (four) times daily - after meals and at bedtime. 1 month Diabetic Testing Supplies for QAC-QHS accuchecks., Disp: 1 each, Rfl: 1 .  hydrochlorothiazide (HYDRODIURIL) 25 MG tablet, Take 25 mg by mouth daily., Disp: , Rfl:  .  metFORMIN (GLUCOPHAGE) 1000 MG tablet, Take 1 tablet (1,000 mg total) by mouth 2 (two) times daily with a meal. Please start on  09/24/2012, Disp: 60 tablet, Rfl: 2 .  pravastatin (PRAVACHOL) 40 MG tablet, Take 1 tablet (40 mg total) by mouth daily., Disp: 30 tablet, Rfl: 2   Review of Systems  Constitutional: Negative for chills and fever.  Eyes: Negative for blurred vision.  Respiratory: Positive for cough and shortness of breath.   Cardiovascular: Negative for chest pain.  Gastrointestinal: Negative for heartburn and nausea.  Genitourinary: Negative for dysuria.  Musculoskeletal: Negative for myalgias.  Skin: Negative for rash.  Neurological: Negative for headaches.      Allergies:  Lisinopril  Physical Examination:  VS: BP 128/72 (BP Location: Left Arm, Cuff Size: Normal)   Pulse 97   Ht 5' 2"  (1.575 m)   Wt 183 lb 6.4 oz (83.2 kg)   SpO2 99%   BMI 33.54 kg/m   General Appearance: No distress  HEENT: PERRLA, no ptosis, no other lesions noticed Pulmonary:normal breath sounds., diaphragmatic excursion normal.No wheezing, No rales   Cardiovascular:  Normal S1,S2.  No m/r/g.     Abdomen:Exam: Benign, Soft, non-tender, No masses  Skin:   warm, no rashes, no ecchymosis  Extremities: normal, no cyanosis, clubbing, warm with normal capillary refill.      Rad results: (The following images and results were reviewed by Dr. MStevenson Clinchon 04/15/2016). CT Chest 01/15/16 FINDINGS: On lung window images, the opacity within the left upper lobe with  somewhat spiculated margins is again identified measuring 12 x 9 x 9 mm. The does appear to be a slightly dilated bronchiole within this opacity, and therefore post infectious or inflammatory process would be favored over neoplasm. In addition, there is an irregular opacity within the posterior medial left lower lobe on image 89 series 3 measuring 10 mm, but on sagittal and coronal images this appears more typical of a post infectious or post inflammatory process. If there is strong suspicion of neoplasm clinically then PET-CT could be performed to assess metabolic  activity. If not then a followup CT the chest in 4 months is recommended. The right lung is clear. Minimal linear opacity is noted posteriorly within the right upper lobe on image 60 most consistent with scarring. No pleural effusion is seen. The central airway is patent.  On soft tissue images, the thyroid gland is unremarkable. On this unenhanced study, no mediastinal or hilar adenopathy is seen. No coronary artery calcifications are noted. The heart is within normal limits in size. The liver appears diffusely low in attenuation possibly indicating fatty infiltration. Correlation with LFTs is recommended. The thoracic vertebrae are in normal alignment with mild degenerative spurring present.  IMPRESSION: 1. 12 x 9 x 9 mm spiculated opacity within the left upper lobe as described on CT of the neck report. There may be a dilated a bronchiole with in this opacity and this could be post infectious or post inflammatory rather than neoplastic in origin. PET-CT would be recommended to assess metabolic activity. If not, then a followup CT of the chest in 4 months is recommended to assess stability. 2. Smaller spiculated area within the left lower lobe most likely postinfectious or postinflammatory. Again this could be followed up with either PET-CT or CT of the chest as described above. 3. Suspect fatty infiltration of the liver diffusely.  PFTs 04/01/16 FVC 102% FEV1 107% FEV1/FVC 83% (actual close) FEF25-75 116% RV 33% TLC 64% ERV 169% DLCO uncorrected 111% Impression: No significant obstruction noted on spirometry, normal DLCO, lung volumes with possible physiologic restriction due to central obesity, however, her lung volumes about a context compared to her spirometry indicating that they may have been a technique issue during the testing.   6 minute walk test-total distance 351 m, no desaturations,  Assessment and Plan:55 yo F smoker with pulmonary nodules.  Pulmonary  nodule Patient with 12 x 9 x 9 mm speaking opacities in the left upper lobe, also has an irregular opacity within the posterior medial left lower lobe drink 10 mm. Reviewed CT shows that these are frontal in size depending on sagittal and coronal images. Overall suspected to be a postinfectious process, however this CT scan is from May 2017. Patient required another CT scan to follow up these nodules prior to follow-up visit. She is advised to continue with reduction of smoking and avoidance of any type of secondhand smoke.  Plan: -CT chest without contrast prior to follow-up visit  Cigarette smoker Tobacco Cessation - Counseling regarding benefits of smoking cessation strategies was provided for more than 3 min. - Educated that at this time smoking- cessation represents the single most important step that patient can take to enhance the length and quality of live. - Educated patient regarding alternatives of behavior interventions, pharmacotherapy including NRT and non-nicotine therapy such, and combinations of both. - Patient at this time:will try to quit on her own.     Updated Medication List Outpatient Encounter Prescriptions as of 04/15/2016  Medication Sig  .  aspirin EC 81 MG tablet Take 1 tablet (81 mg total) by mouth daily.  . clopidogrel (PLAVIX) 75 MG tablet Take 1 tablet (75 mg total) by mouth daily with breakfast.  . gabapentin (NEURONTIN) 100 MG capsule Take 1 capsule (100 mg total) by mouth 3 (three) times daily.  Marland Kitchen glipiZIDE (GLUCOTROL XL) 5 MG 24 hr tablet Take 1 tablet (5 mg total) by mouth daily.  Marland Kitchen glucose monitoring kit (FREESTYLE) monitoring kit 1 each by Does not apply route 4 (four) times daily - after meals and at bedtime. 1 month Diabetic Testing Supplies for QAC-QHS accuchecks.  . hydrochlorothiazide (HYDRODIURIL) 25 MG tablet Take 25 mg by mouth daily.  . metFORMIN (GLUCOPHAGE) 1000 MG tablet Take 1 tablet (1,000 mg total) by mouth 2 (two) times daily with a  meal. Please start on 09/24/2012  . pravastatin (PRAVACHOL) 40 MG tablet Take 1 tablet (40 mg total) by mouth daily.   No facility-administered encounter medications on file as of 04/15/2016.     Orders for this visit: Orders Placed This Encounter  Procedures  . CT Chest Wo Contrast    Standing Status:   Future    Standing Expiration Date:   06/15/2017    Scheduling Instructions:     Please schedule before November follow up office visit. Thanks!    Order Specific Question:   Reason for Exam (SYMPTOM  OR DIAGNOSIS REQUIRED)    Answer:   pulmonary nodules    Order Specific Question:   Is patient pregnant?    Answer:   Yes    Order Specific Question:   Preferred imaging location?    Answer:   [facility]    Thank  you for the visitation and for allowing  [facility] to assist in the care of your patient. Our recommendations are noted above.  Please contact us if we can be of further service.  Vilinda Boehringer, MD [facility] Office Number: [phone]  Note: This note was prepared with Dragon dictation along with smaller phrase technology. Any transcriptional errors that result from this process are unintentional.

## 2016-04-15 NOTE — Assessment & Plan Note (Signed)
Tobacco Cessation - Counseling regarding benefits of smoking cessation strategies was provided for more than 3 min. - Educated that at this time smoking- cessation represents the single most important step that patient can take to enhance the length and quality of live. - Educated patient regarding alternatives of behavior interventions, pharmacotherapy including NRT and non-nicotine therapy such, and combinations of both. - Patient at this time:will try to quit on her own.

## 2016-05-07 ENCOUNTER — Telehealth: Payer: Self-pay | Admitting: Internal Medicine

## 2016-05-07 NOTE — Telephone Encounter (Signed)
Attempted x 3 to contact patient for r/s appt on 06/12/16 with Dr. Dema SeverinMungal.  No ans no vm.  Cancelled appt.

## 2016-06-09 ENCOUNTER — Ambulatory Visit: Payer: Medicare Other

## 2016-06-12 ENCOUNTER — Ambulatory Visit: Payer: Medicare Other | Admitting: Internal Medicine

## 2016-06-16 ENCOUNTER — Ambulatory Visit
Admission: RE | Admit: 2016-06-16 | Discharge: 2016-06-16 | Disposition: A | Discharge status: Home / Self Care | Payer: Medicare Other | Source: Ambulatory Visit | Attending: Internal Medicine | Admitting: Internal Medicine

## 2016-06-16 DIAGNOSIS — I700 Atherosclerosis of aorta: Secondary | ICD-10-CM | POA: Diagnosis not present

## 2016-06-16 DIAGNOSIS — K760 Fatty (change of) liver, not elsewhere classified: Secondary | ICD-10-CM | POA: Insufficient documentation

## 2016-06-16 DIAGNOSIS — R911 Solitary pulmonary nodule: Secondary | ICD-10-CM | POA: Insufficient documentation

## 2016-06-19 ENCOUNTER — Ambulatory Visit (INDEPENDENT_AMBULATORY_CARE_PROVIDER_SITE_OTHER): Payer: Medicare Other | Admitting: Internal Medicine

## 2016-06-19 ENCOUNTER — Encounter: Payer: Self-pay | Admitting: Internal Medicine

## 2016-06-19 VITALS — BP 142/90 | HR 86 | Wt 183.0 lb

## 2016-06-19 DIAGNOSIS — F17210 Nicotine dependence, cigarettes, uncomplicated: Secondary | ICD-10-CM

## 2016-06-19 DIAGNOSIS — R911 Solitary pulmonary nodule: Secondary | ICD-10-CM

## 2016-06-19 NOTE — Assessment & Plan Note (Signed)
Patient now with 10 x 11 mm irregular nodule opacity left upper lobe, along with 9 x 10 mm left lower lobe nodule. These are slightly smaller since her last CT chest, in addition there central air bronchograms, favoring a postinflammatory process versus a neoplasm. She still at moderate to high risk, given her continued smoking and irregularity shape of these nodules. At today's visit we discussed further invasive diagnostic workup such as bronchoscopy or biopsy versus surveillance. Patient at this time and decided on surveillance with a follow-up CAT scan in 6 months, which I'm in agreement with. If nodules continue to be present with characteristic changes at follow-up CT, may need further invasive workup with navigational bronchoscopy and biopsy.  She is advised to continue with reduction of smoking and avoidance of any type of secondhand smoke.   Plan: -CT chest without contrast prior to follow-up visit in 4 months

## 2016-06-19 NOTE — Progress Notes (Signed)
[facility] Consultation      MRN# [id number] Felicia Chavez 20-Dec-1960   CC: Chief Complaint  Patient presents with  . Follow-up    CT results: breathing doing well, no SOB or cough      Brief History: 55 yo F current smoker,6 cigs per day, seen for pulmonary nodule, 1 cm irregular coronary opacity in the left upper lobe, after a bout of shingles.    Events since last clinic visit: Patient presents today for follow-up visit of her CT scan Overall, she is doing well. No shortness of breath, no cough no wheezing, no fever, no nausea, no vomiting, no diarrhea. Since her last visit she has quit smoking. She lives with her daughter, who still smokes at times in the house.     Current Outpatient Prescriptions:  .  aspirin EC 81 MG tablet, Take 1 tablet (81 mg total) by mouth daily., Disp: 30 tablet, Rfl: 2 .  clopidogrel (PLAVIX) 75 MG tablet, Take 1 tablet (75 mg total) by mouth daily with breakfast., Disp: 30 tablet, Rfl: 2 .  dapagliflozin propanediol (FARXIGA) 10 MG TABS tablet, Take 10 mg by mouth daily., Disp: , Rfl:  .  gabapentin (NEURONTIN) 100 MG capsule, Take 1 capsule (100 mg total) by mouth 3 (three) times daily., Disp: 90 capsule, Rfl: 2 .  glipiZIDE (GLUCOTROL XL) 5 MG 24 hr tablet, Take 1 tablet (5 mg total) by mouth daily., Disp: 30 tablet, Rfl: 2 .  glucose monitoring kit (FREESTYLE) monitoring kit, 1 each by Does not apply route 4 (four) times daily - after meals and at bedtime. 1 month Diabetic Testing Supplies for QAC-QHS accuchecks., Disp: 1 each, Rfl: 1 .  hydrochlorothiazide (HYDRODIURIL) 25 MG tablet, Take 25 mg by mouth daily., Disp: , Rfl:  .  metFORMIN (GLUCOPHAGE) 1000 MG tablet, Take 1 tablet (1,000 mg total) by mouth 2 (two) times daily with a meal. Please start on 09/24/2012, Disp: 60 tablet, Rfl: 2 .  pravastatin (PRAVACHOL) 40 MG tablet, Take 1 tablet (40 mg total) by mouth daily., Disp: 30 tablet, Rfl: 2   Review of Systems    Constitutional: Negative for chills and fever.  Eyes: Negative for blurred vision.  Respiratory: Positive for cough and shortness of breath.   Cardiovascular: Negative for chest pain.  Gastrointestinal: Negative for heartburn and nausea.  Genitourinary: Negative for dysuria.  Musculoskeletal: Negative for myalgias.  Skin: Negative for rash.  Neurological: Negative for headaches.      Allergies:  Lisinopril  Physical Examination:  VS: BP (!) 142/90 (BP Location: Left Arm, Cuff Size: Normal)   Pulse 86   Wt 183 lb (83 kg)   SpO2 100%   BMI 33.47 kg/m   General Appearance: No distress  HEENT: PERRLA, no ptosis, no other lesions noticed Pulmonary:normal breath sounds., diaphragmatic excursion normal.No wheezing, No rales   Cardiovascular:  Normal S1,S2.  No m/r/g.     Abdomen:Exam: Benign, Soft, non-tender, No masses  Skin:   warm, no rashes, no ecchymosis  Extremities: normal, no cyanosis, clubbing, warm with normal capillary refill.      Rad results: (The following images and results were reviewed by Dr. Stevenson Clinch on 06/19/2016). CT Chest 06/16/16  FINDINGS: Cardiovascular:  No acute findings. Aortic atherosclerosis.  Mediastinum/Nodes: No masses or pathologically enlarged lymph nodes identified on this unenhanced exam.  Lungs/Pleura: 10 x 11 mm irregular nodular opacity in the left upper lobe on image 45/3 remains stable in size and appearance. A similar irregular  nodular density in the left lower lobe measuring 9 x 10 mm on image 89/3 is also stable. These nodules are nonspecific, but contain central air bronchograms, favoring postinflammatory process over neoplasm. No new or enlarging pulmonary nodules or masses are identified. No evidence of pulmonary consolidation or pleural effusion.  Upper Abdomen:  Diffuse hepatic steatosis again noted.  Musculoskeletal: No suspicious bone lesions or other significant abnormality.  IMPRESSION: Stable indeterminate  left upper and lower lobe pulmonary nodules, largest measuring 11 mm. Recommend continued followup by chest CT in 6 months. This recommendation follows the consensus statement: Guidelines for Management of Small Pulmonary Nodules Detected on CT Images: From the Fleischner Society 2017; Radiology 2017; 284:228-243.  Aortic atherosclerosis and hepatic steatosis incidentally noted.   PFTs 04/01/16 FVC 102% FEV1 107% FEV1/FVC 83% (actual close) FEF25-75 116% RV 33% TLC 64% ERV 169% DLCO uncorrected 111% Impression: No significant obstruction noted on spirometry, normal DLCO, lung volumes with possible physiologic restriction due to central obesity, however, her lung volumes about a context compared to her spirometry indicating that they may have been a technique issue during the testing.   6 minute walk test-total distance 351 m, no desaturations,  Assessment and Plan:55 yo F smoker with pulmonary nodules.  Pulmonary nodule Patient now with 10 x 11 mm irregular nodule opacity left upper lobe, along with 9 x 10 mm left lower lobe nodule. These are slightly smaller since her last CT chest, in addition there central air bronchograms, favoring a postinflammatory process versus a neoplasm. She still at moderate to high risk, given her continued smoking and irregularity shape of these nodules. At today's visit we discussed further invasive diagnostic workup such as bronchoscopy or biopsy versus surveillance. Patient at this time and decided on surveillance with a follow-up CAT scan in 6 months, which I'm in agreement with. If nodules continue to be present with characteristic changes at follow-up CT, may need further invasive workup with navigational bronchoscopy and biopsy.  She is advised to continue with reduction of smoking and avoidance of any type of secondhand smoke.   Plan: -CT chest without contrast prior to follow-up visit in 4 months  Cigarette smoker Tobacco Cessation -  Counseling regarding benefits of smoking cessation strategies was provided for more than 3 min. - Educated that at this time smoking- cessation represents the single most important step that patient can take to enhance the length and quality of live. - Educated patient regarding alternatives of behavior interventions, pharmacotherapy including NRT and non-nicotine therapy such, and combinations of both. - Patient at this time has quit since 04/15/16    Updated Medication List Outpatient Encounter Prescriptions as of 06/19/2016  Medication Sig  . aspirin EC 81 MG tablet Take 1 tablet (81 mg total) by mouth daily.  . clopidogrel (PLAVIX) 75 MG tablet Take 1 tablet (75 mg total) by mouth daily with breakfast.  . dapagliflozin propanediol (FARXIGA) 10 MG TABS tablet Take 10 mg by mouth daily.  Marland Kitchen gabapentin (NEURONTIN) 100 MG capsule Take 1 capsule (100 mg total) by mouth 3 (three) times daily.  Marland Kitchen glipiZIDE (GLUCOTROL XL) 5 MG 24 hr tablet Take 1 tablet (5 mg total) by mouth daily.  Marland Kitchen glucose monitoring kit (FREESTYLE) monitoring kit 1 each by Does not apply route 4 (four) times daily - after meals and at bedtime. 1 month Diabetic Testing Supplies for QAC-QHS accuchecks.  . hydrochlorothiazide (HYDRODIURIL) 25 MG tablet Take 25 mg by mouth daily.  . metFORMIN (GLUCOPHAGE) 1000 MG tablet Take  1 tablet (1,000 mg total) by mouth 2 (two) times daily with a meal. Please start on 09/24/2012  . pravastatin (PRAVACHOL) 40 MG tablet Take 1 tablet (40 mg total) by mouth daily.   No facility-administered encounter medications on file as of 06/19/2016.     Orders for this visit: No orders of the defined types were placed in this encounter.   Thank  you for the visitation and for allowing  [facility] to assist in the care of your patient. Our recommendations are noted above.  Please contact us if we can be of further service.  Vilinda Boehringer, MD [facility] Office Number: [phone]  Note: This note was prepared with Dragon dictation along with smaller phrase technology. Any transcriptional errors that result from this process are unintentional.

## 2016-06-19 NOTE — Patient Instructions (Signed)
Follow up with Dr. Belia HemanKasa in 4 months - CT Chest w/o contrast prior to follow up visit for lung nodules - cont with avoid all forms of tobacco - exercise as tolerated.

## 2016-06-19 NOTE — Addendum Note (Signed)
Addended by: Meyer CoryAHMAD, MISTY R on: 06/19/2016 10:49 AM   Modules accepted: Orders

## 2016-06-19 NOTE — Assessment & Plan Note (Addendum)
Tobacco Cessation - Counseling regarding benefits of smoking cessation strategies was provided for more than 3 min. - Educated that at this time smoking- cessation represents the single most important step that patient can take to enhance the length and quality of live. - Educated patient regarding alternatives of behavior interventions, pharmacotherapy including NRT and non-nicotine therapy such, and combinations of both. - Patient at this time has quit since 04/15/16

## 2016-07-14 DIAGNOSIS — E119 Type 2 diabetes mellitus without complications: Secondary | ICD-10-CM | POA: Insufficient documentation

## 2016-07-17 ENCOUNTER — Other Ambulatory Visit (HOSPITAL_COMMUNITY): Payer: Self-pay | Admitting: Interventional Radiology

## 2016-07-17 DIAGNOSIS — I771 Stricture of artery: Secondary | ICD-10-CM

## 2016-08-04 ENCOUNTER — Ambulatory Visit (HOSPITAL_COMMUNITY)
Admission: RE | Admit: 2016-08-04 | Discharge: 2016-08-04 | Disposition: A | Discharge status: Home / Self Care | Payer: Medicare Other | Source: Ambulatory Visit | Attending: Interventional Radiology | Admitting: Interventional Radiology

## 2016-08-04 DIAGNOSIS — I771 Stricture of artery: Secondary | ICD-10-CM | POA: Diagnosis not present

## 2016-08-04 NOTE — Progress Notes (Addendum)
VASCULAR LAB PRELIMINARY  PRELIMINARY  PRELIMINARY  PRELIMINARY  Carotid duplex completed.    Preliminary report:  Technically difficult due to patient anatomy, depth of the arteries and tortuosity. Right :  60-79% internal carotid artery stenosis.  Left !5 to 39% ICA stenosis. Bilateral - Vertebral artery flow is antegrade. No significant change since previous exam   Matsue Strom, RVS 08/04/2016, 12:17 PM

## 2016-08-05 LAB — VAS US CAROTID
LCCADDIAS: -19 cm/s
LCCADSYS: -76 cm/s
LEFT ECA DIAS: -15 cm/s
LEFT VERTEBRAL DIAS: 23 cm/s
LICADSYS: -52 cm/s
LICAPDIAS: -19 cm/s
Left CCA prox dias: 15 cm/s
Left CCA prox sys: 77 cm/s
Left ICA dist dias: -20 cm/s
Left ICA prox sys: -47 cm/s
RCCAPDIAS: -20 cm/s
RCCAPSYS: -85 cm/s
RIGHT ECA DIAS: -15 cm/s
RIGHT VERTEBRAL DIAS: -33 cm/s
Right cca dist sys: -76 cm/s

## 2016-08-21 ENCOUNTER — Telehealth (HOSPITAL_COMMUNITY): Payer: Self-pay

## 2016-08-21 NOTE — Telephone Encounter (Signed)
Called, no answer, no vm. AW 

## 2016-11-25 ENCOUNTER — Ambulatory Visit: Payer: Medicare Other | Attending: Internal Medicine

## 2016-11-28 ENCOUNTER — Ambulatory Visit: Payer: Medicare Other | Admitting: Internal Medicine

## 2016-12-01 ENCOUNTER — Encounter: Payer: Self-pay | Admitting: Internal Medicine

## 2016-12-15 ENCOUNTER — Encounter: Payer: Self-pay | Admitting: Podiatry

## 2016-12-15 ENCOUNTER — Ambulatory Visit (INDEPENDENT_AMBULATORY_CARE_PROVIDER_SITE_OTHER): Payer: Medicare Other

## 2016-12-15 ENCOUNTER — Ambulatory Visit (INDEPENDENT_AMBULATORY_CARE_PROVIDER_SITE_OTHER): Payer: Medicare Other | Admitting: Podiatry

## 2016-12-15 DIAGNOSIS — E119 Type 2 diabetes mellitus without complications: Secondary | ICD-10-CM

## 2016-12-15 DIAGNOSIS — M722 Plantar fascial fibromatosis: Secondary | ICD-10-CM | POA: Diagnosis not present

## 2016-12-15 DIAGNOSIS — M79676 Pain in unspecified toe(s): Secondary | ICD-10-CM

## 2016-12-15 DIAGNOSIS — M898X9 Other specified disorders of bone, unspecified site: Secondary | ICD-10-CM | POA: Diagnosis not present

## 2016-12-15 DIAGNOSIS — B351 Tinea unguium: Secondary | ICD-10-CM | POA: Diagnosis not present

## 2016-12-15 NOTE — Progress Notes (Signed)
   Subjective:    Patient ID: Felicia Chavez, female    DOB: [birth year], 56 y.o.   MRN: [id number]  HPI she presents today on referral from her primary doctor with a history of diabetes concerned about her feet. She denies any trauma to her feet denies any pain in her feet at all. States that she wears flip flops most of the time. History of strep stroke 2012 in 2015.   Review of Systems  Musculoskeletal: Positive for arthralgias.  Psychiatric/Behavioral: Positive for confusion.  All other systems reviewed and are negative.      Objective:   Physical Exam: Vital signs are stable she is alert and oriented 3. Neurologic sensorium is intact vascular evaluation strong palpable pulses bilateral capillary fill time is immediate. Deep tendon reflexes are intact but diminished bilateral. Muscle strength is normal bilateral. Orthopedic evaluation demonstrates pes planus bilateral eater relatively rectus. Cutaneous evaluation demonstrates dry xerotic skin around the heels from wearing the flip-flops. She also has dry skin to the ankles and legs. This is possibly associated with diabetes. She also has thick yellow dystrophic onychomycotic nails.      Assessment & Plan:  Assessment: Diabetes mellitus without complications at this point I see no complications associated with diabetes or concerns at this point. Pain in limb second onychomycosis.  Plan: Debridement of all reactive hyperkeratotic tissue debridement of toenails 1 through 5 bilateral.

## 2017-03-16 ENCOUNTER — Ambulatory Visit: Payer: Medicare Other | Admitting: Podiatry

## 2017-06-01 IMAGING — CT CT NECK W/ CM
2 of 3 series · 7 of 14 positions shown, 8 images · IV contrast (omnipaque)
Comparison: None.

CLINICAL DATA: Right neck swelling and pain. Symptoms began
yesterday.

EXAM:
CT NECK WITH CONTRAST
TECHNIQUE: Multidetector CT imaging of the neck was performed using the
standard protocol following the bolus administration of intravenous
contrast.
CONTRAST:  75mL OMNIPAQUE IOHEXOL 300 MG/ML  SOLN

[Series 2: axial neck · axial · 0.60mm/px · z∈[-213,-101]mm · 3 of 114 slices shown]
[im 29/114  bone]
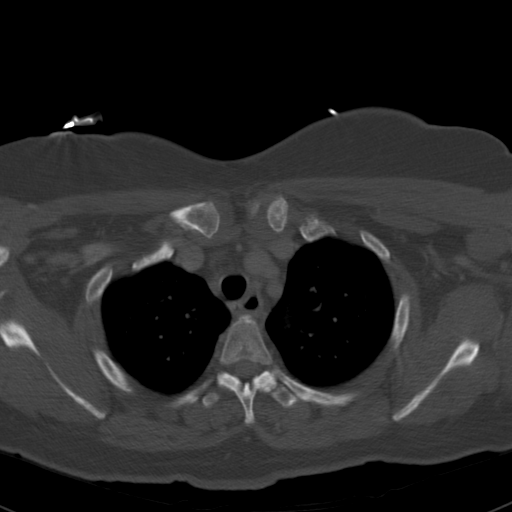
[im 57/114  bone]
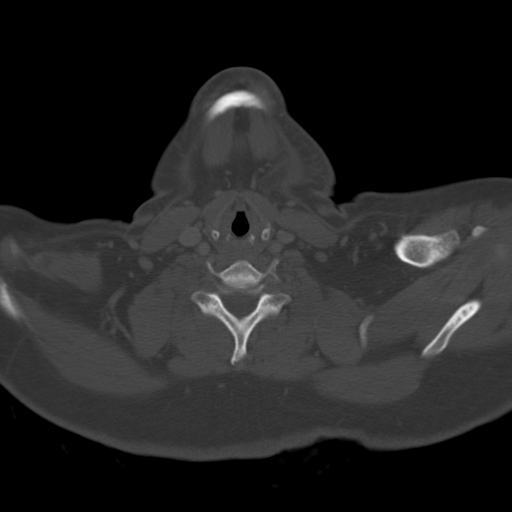
[im 85/114  bone]
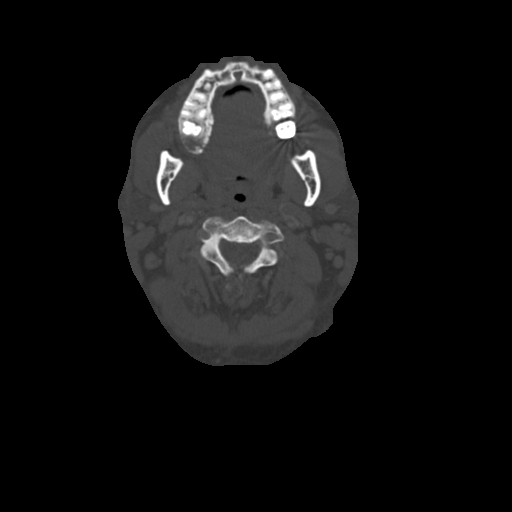

[Series 7: ax oropharynx · axial · 0.51mm/px · z∈[-263,-129]mm · 4 of 123 slices shown, 5 images]
[im 25/123  soft-tissue]
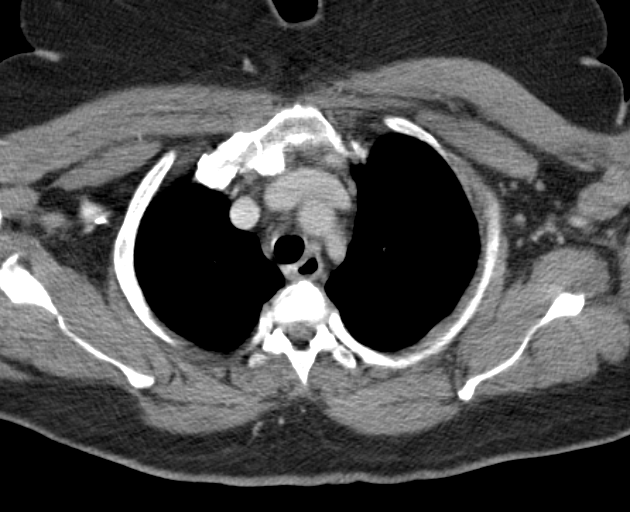
[im 25/123  bone]
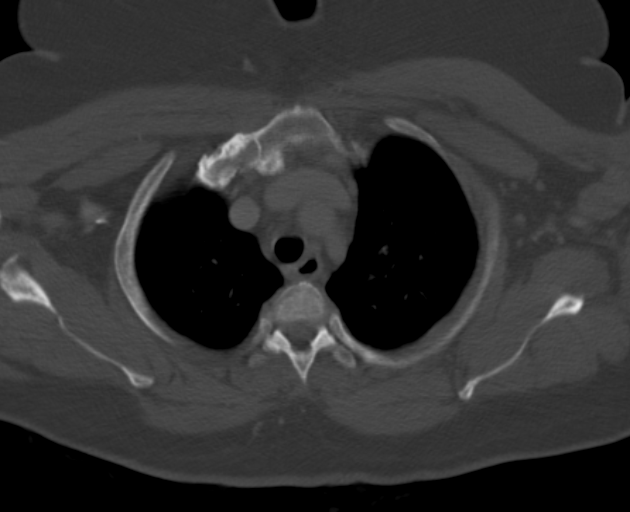
[im 49/123  bone]
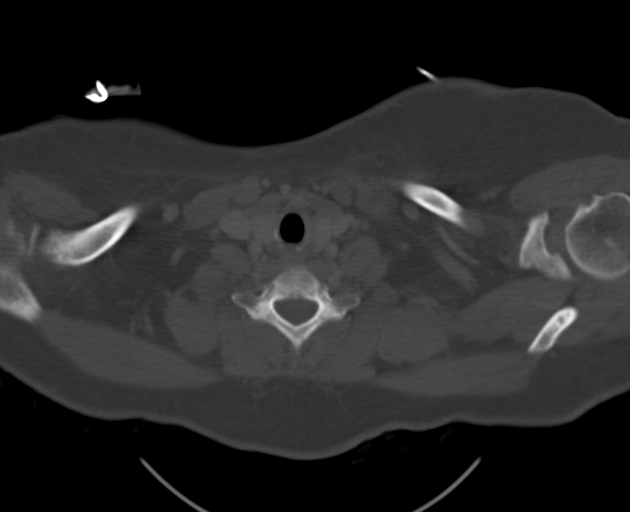
[im 74/123  bone]
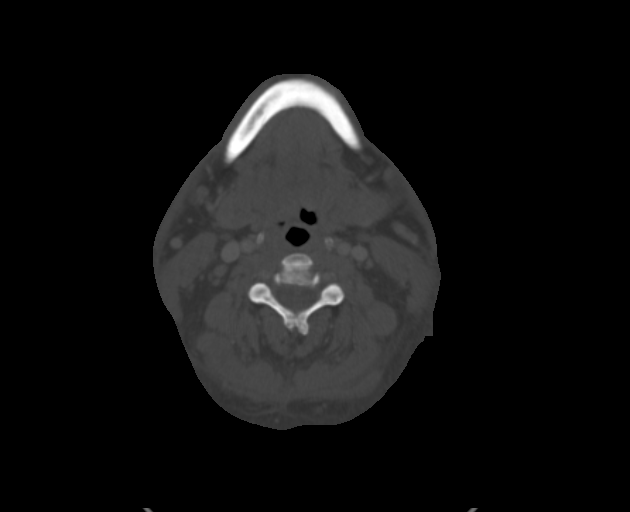
[im 98/123  bone]
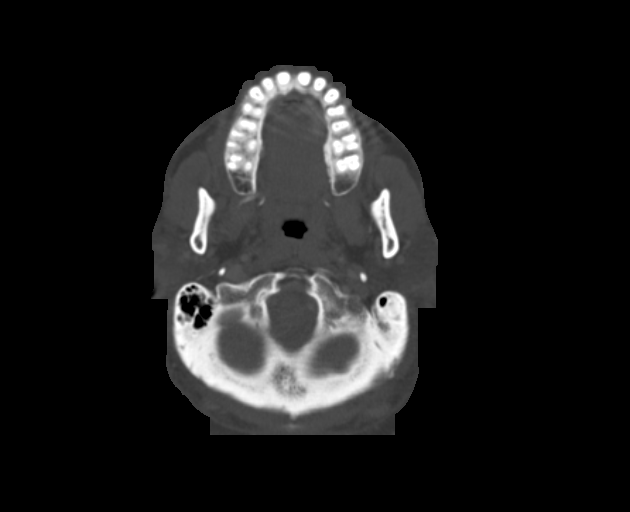

[7 of 14 positions shown; findings below may reference images not displayed]

FINDINGS: Pharynx and larynx: The nasopharynx, oropharynx, oral cavity,
hypopharynx, and larynx are unremarkable.

Salivary glands: There is diffuse infiltration of the subcutaneous
fat of the face and upper neck which is overall relatively symmetric
although slightly greater on the left in the lower facial region.
There is thickening of the platysma bilaterally. Inflammation is
present superficial to both parotid glands, however the glands
themselves are not definitely abnormal. The submandibular glands are
unremarkable. No organized fluid collection/abscess is seen. No
definite dental origin of the inflammation is identified.

Thyroid: Unremarkable.

Lymph nodes: Mildly increased number of subcentimeter lymph nodes
throughout the neck, likely reactive. Level V lymph nodes measure up
to 9 mm in short axis on the right and 8 mm on the left. Left level
II lymph nodes measure up to 8 mm.

Vascular: Major vascular structures of the neck appear patent.

Limited intracranial: Visualized portion of the brain is
unremarkable.

Visualized orbits: Unremarkable.

Mastoids and visualized paranasal sinuses: Small left mastoid
effusion. Visualized paranasal sinuses clear.

Skeleton: No acute osseous abnormality identified.

Upper chest: 10 mm irregular opacity in the left upper lobe (series
4, image 35).
IMPRESSION: 1. Mild-to-moderate subcutaneous inflammatory change throughout the
face and upper neck, relatively symmetric although slightly greater
in the left lower face. This is suggestive of cellulitis, without a
definite salivary or dental source identified. No abscess.
2. 10 mm irregular pulmonary opacity in the left upper lobe. While
this may represent a focal area of inflammation or scarring,
bronchogenic neoplasm is also a consideration (particularly given
the patient's history of smoking) and a follow-up CT is recommended
in 3 months.

## 2017-07-13 ENCOUNTER — Ambulatory Visit: Payer: Medicare Other | Attending: Internal Medicine

## 2017-07-14 ENCOUNTER — Ambulatory Visit: Payer: Medicare Other | Admitting: Internal Medicine

## 2017-07-17 ENCOUNTER — Telehealth (HOSPITAL_COMMUNITY): Payer: Self-pay

## 2017-07-17 ENCOUNTER — Encounter: Payer: Self-pay | Admitting: Internal Medicine

## 2017-07-17 NOTE — Telephone Encounter (Signed)
Called to schedule f/u us carotid, no answer, no vm. AW 

## 2017-09-23 ENCOUNTER — Other Ambulatory Visit (HOSPITAL_COMMUNITY): Payer: Self-pay | Admitting: Interventional Radiology

## 2017-09-23 DIAGNOSIS — I771 Stricture of artery: Secondary | ICD-10-CM

## 2017-10-16 ENCOUNTER — Encounter (HOSPITAL_COMMUNITY): Payer: Self-pay

## 2017-10-16 ENCOUNTER — Ambulatory Visit (HOSPITAL_COMMUNITY): Payer: Medicare Other | Attending: Interventional Radiology

## 2018-02-11 ENCOUNTER — Encounter: Payer: Self-pay | Admitting: Internal Medicine

## 2019-01-28 ENCOUNTER — Telehealth: Payer: Self-pay | Admitting: Internal Medicine

## 2019-01-28 NOTE — Telephone Encounter (Signed)
Called pt to confirm 01/31/2019 appt. Screen, give directions, and let her know of visitor restrictions.

## 2019-01-28 NOTE — Progress Notes (Deleted)
* [facility]     Assessment and Plan:     Date: 01/28/2019  MRN# [id number] Felicia Chavez May 20, 1961   Felicia Chavez is a 58 y.o. old female seen in follow up for chief complaint of  No chief complaint on file.    HPI:  Felicia Chavez is a 58 y.o. female with a history of pulmonary nodule, she was last seen here in 2017 by Dr. Stevenson Clinch.   **CT chest 06/16/2016>> images personally reviewed, there is a pulmonary nodule in the left upper lobe, this appears to be within a cavity, might represent a mucocele.  There is mild central bronchiectasis. IMPRESSION: Stable indeterminate left upper and lower lobe pulmonary nodules, largest measuring 11 mm. Recommend continued followup by chest CT in 6 months. This recommendation follows the consensus statement: Guidelines for Management of Small Pulmonary Nodules Detected on CT Images: From the Fleischner Society 2017; Radiology 2017; 284:228-243.  PFTs 04/01/16 FVC 102% FEV1 107% FEV1/FVC 83% FEF25-75 116% RV 33% TLC 64% ERV 169% DLCO uncorrected 111% Impression: No significant obstruction noted on spirometry, normal DLCO, lung volumes with possible physiologic restriction due to central obesity. 6 minute walk test-total distance 351 m, no desaturations,  Medication:    Current Outpatient Medications:  .  aspirin EC 81 MG tablet, Take 1 tablet (81 mg total) by mouth daily., Disp: 30 tablet, Rfl: 2 .  clopidogrel (PLAVIX) 75 MG tablet, Take 1 tablet (75 mg total) by mouth daily with breakfast., Disp: 30 tablet, Rfl: 2 .  dapagliflozin propanediol (FARXIGA) 10 MG TABS tablet, Take 10 mg by mouth daily., Disp: , Rfl:  .  gabapentin (NEURONTIN) 100 MG capsule, Take 1 capsule (100 mg total) by mouth 3 (three) times daily., Disp: 90 capsule, Rfl: 2 .  glipiZIDE (GLUCOTROL XL) 5 MG 24 hr tablet, Take 1 tablet (5 mg total) by mouth daily., Disp: 30 tablet, Rfl: 2 .  glucose monitoring kit (FREESTYLE) monitoring  kit, 1 each by Does not apply route 4 (four) times daily - after meals and at bedtime. 1 month Diabetic Testing Supplies for QAC-QHS accuchecks., Disp: 1 each, Rfl: 1 .  hydrochlorothiazide (HYDRODIURIL) 25 MG tablet, Take 25 mg by mouth daily., Disp: , Rfl:  .  metFORMIN (GLUCOPHAGE) 1000 MG tablet, Take 1 tablet (1,000 mg total) by mouth 2 (two) times daily with a meal. Please start on 09/24/2012, Disp: 60 tablet, Rfl: 2 .  pioglitazone (ACTOS) 15 MG tablet, Take by mouth., Disp: , Rfl:  .  pravastatin (PRAVACHOL) 40 MG tablet, Take 1 tablet (40 mg total) by mouth daily., Disp: 30 tablet, Rfl: 2 .  sitaGLIPtin-metformin (JANUMET) 50-1000 MG tablet, TAKE (1) TABLET BY MOUTH TWICE A DAY WITH MEALS (BREAKFAST AND SUPPER), Disp: , Rfl:    Allergies:  Lisinopril      LABORATORY PANEL:   CBC No results for input(s): WBC, HGB, HCT, PLT in the last 168 hours. ------------------------------------------------------------------------------------------------------------------  Chemistries  No results for input(s): NA, K, CL, CO2, GLUCOSE, BUN, CREATININE, CALCIUM, MG, AST, ALT, ALKPHOS, BILITOT in the last 168 hours.  Invalid input(s): GFRCGP ------------------------------------------------------------------------------------------------------------------  Cardiac Enzymes No results for input(s): TROPONINI in the last 168 hours. ------------------------------------------------------------  RADIOLOGY:   No results found for this or any previous visit. Results for orders placed during the hospital encounter of 09/20/12  DG Chest 2 View   Narrative *RADIOLOGY REPORT*  Clinical Data: Stroke protocol.  CHEST - 2 VIEW  Comparison: None.  Findings: The heart size  and pulmonary vascularity are normal. The lungs appear clear and expanded without focal air space disease or consolidation. No blunting of the costophrenic angles.  No pneumothorax.  Mediastinal contours appear intact.   Degenerative changes in the spine.  The  IMPRESSION: No evidence of active pulmonary disease.   Original Report Authenticated By: Lucienne Capers, M.D.    ------------------------------------------------------------------------------------------------------------------  Thank  you for allowing [facility] to assist in the care of your patient. Our recommendations are noted above.  Please contact us if we can be of further service.   Marda Stalker, M.D., F.C.C.P.  Board Certified in Internal Medicine, Pulmonary Medicine, [place], and Sleep Medicine.  [facility] Office Number: [phone]  01/28/2019

## 2019-01-31 ENCOUNTER — Institutional Professional Consult (permissible substitution): Payer: Medicare Other | Admitting: Internal Medicine

## 2019-10-27 ENCOUNTER — Observation Stay (HOSPITAL_COMMUNITY)
Admission: EM | Admit: 2019-10-27 | Discharge: 2019-10-28 | Disposition: A | Discharge status: Home / Self Care | Payer: Medicare Other | Attending: Internal Medicine | Admitting: Internal Medicine

## 2019-10-27 ENCOUNTER — Encounter (HOSPITAL_COMMUNITY): Payer: Self-pay | Admitting: Emergency Medicine

## 2019-10-27 ENCOUNTER — Emergency Department (HOSPITAL_COMMUNITY): Payer: Medicare Other

## 2019-10-27 ENCOUNTER — Other Ambulatory Visit: Payer: Self-pay

## 2019-10-27 DIAGNOSIS — J810 Acute pulmonary edema: Secondary | ICD-10-CM | POA: Diagnosis present

## 2019-10-27 DIAGNOSIS — M1990 Unspecified osteoarthritis, unspecified site: Secondary | ICD-10-CM | POA: Diagnosis not present

## 2019-10-27 DIAGNOSIS — R9431 Abnormal electrocardiogram [ECG] [EKG]: Secondary | ICD-10-CM | POA: Diagnosis not present

## 2019-10-27 DIAGNOSIS — I110 Hypertensive heart disease with heart failure: Secondary | ICD-10-CM | POA: Insufficient documentation

## 2019-10-27 DIAGNOSIS — Z794 Long term (current) use of insulin: Secondary | ICD-10-CM | POA: Diagnosis not present

## 2019-10-27 DIAGNOSIS — Z87891 Personal history of nicotine dependence: Secondary | ICD-10-CM | POA: Diagnosis not present

## 2019-10-27 DIAGNOSIS — K219 Gastro-esophageal reflux disease without esophagitis: Secondary | ICD-10-CM | POA: Insufficient documentation

## 2019-10-27 DIAGNOSIS — Z888 Allergy status to other drugs, medicaments and biological substances status: Secondary | ICD-10-CM | POA: Diagnosis not present

## 2019-10-27 DIAGNOSIS — R0902 Hypoxemia: Secondary | ICD-10-CM | POA: Diagnosis present

## 2019-10-27 DIAGNOSIS — I10 Essential (primary) hypertension: Secondary | ICD-10-CM | POA: Diagnosis present

## 2019-10-27 DIAGNOSIS — Z7982 Long term (current) use of aspirin: Secondary | ICD-10-CM | POA: Insufficient documentation

## 2019-10-27 DIAGNOSIS — I340 Nonrheumatic mitral (valve) insufficiency: Secondary | ICD-10-CM | POA: Insufficient documentation

## 2019-10-27 DIAGNOSIS — Z20822 Contact with and (suspected) exposure to covid-19: Secondary | ICD-10-CM | POA: Insufficient documentation

## 2019-10-27 DIAGNOSIS — R0603 Acute respiratory distress: Secondary | ICD-10-CM | POA: Diagnosis present

## 2019-10-27 DIAGNOSIS — E785 Hyperlipidemia, unspecified: Secondary | ICD-10-CM | POA: Insufficient documentation

## 2019-10-27 DIAGNOSIS — E119 Type 2 diabetes mellitus without complications: Secondary | ICD-10-CM

## 2019-10-27 DIAGNOSIS — J811 Chronic pulmonary edema: Secondary | ICD-10-CM | POA: Insufficient documentation

## 2019-10-27 DIAGNOSIS — Z79899 Other long term (current) drug therapy: Secondary | ICD-10-CM | POA: Diagnosis not present

## 2019-10-27 DIAGNOSIS — I509 Heart failure, unspecified: Secondary | ICD-10-CM | POA: Diagnosis not present

## 2019-10-27 DIAGNOSIS — I6930 Unspecified sequelae of cerebral infarction: Secondary | ICD-10-CM | POA: Insufficient documentation

## 2019-10-27 DIAGNOSIS — Z9119 Patient's noncompliance with other medical treatment and regimen: Secondary | ICD-10-CM | POA: Diagnosis not present

## 2019-10-27 DIAGNOSIS — Z8249 Family history of ischemic heart disease and other diseases of the circulatory system: Secondary | ICD-10-CM | POA: Insufficient documentation

## 2019-10-27 DIAGNOSIS — J9601 Acute respiratory failure with hypoxia: Secondary | ICD-10-CM | POA: Diagnosis not present

## 2019-10-27 DIAGNOSIS — Z7902 Long term (current) use of antithrombotics/antiplatelets: Secondary | ICD-10-CM | POA: Diagnosis not present

## 2019-10-27 DIAGNOSIS — I471 Supraventricular tachycardia: Secondary | ICD-10-CM | POA: Insufficient documentation

## 2019-10-27 DIAGNOSIS — R000 Tachycardia, unspecified: Secondary | ICD-10-CM | POA: Diagnosis present

## 2019-10-27 DIAGNOSIS — J189 Pneumonia, unspecified organism: Secondary | ICD-10-CM | POA: Diagnosis not present

## 2019-10-27 DIAGNOSIS — R05 Cough: Secondary | ICD-10-CM | POA: Diagnosis not present

## 2019-10-27 LAB — COMPREHENSIVE METABOLIC PANEL
ALT: 92 U/L — ABNORMAL HIGH (ref 0–44)
AST: 91 U/L — ABNORMAL HIGH (ref 15–41)
Albumin: 4.1 g/dL (ref 3.5–5.0)
Alkaline Phosphatase: 108 U/L (ref 38–126)
Anion gap: 10 (ref 5–15)
BUN: 11 mg/dL (ref 6–20)
CO2: 23 mmol/L (ref 22–32)
Calcium: 8.5 mg/dL — ABNORMAL LOW (ref 8.9–10.3)
Chloride: 100 mmol/L (ref 98–111)
Creatinine, Ser: 0.73 mg/dL (ref 0.44–1.00)
GFR calc Af Amer: >60 mL/min (ref >60)
GFR calc non Af Amer: >60 mL/min (ref >60)
Glucose, Bld: 483 mg/dL — ABNORMAL HIGH (ref 70–99)
Potassium: 3.7 mmol/L (ref 3.5–5.1)
Sodium: 133 mmol/L — ABNORMAL LOW (ref 135–145)
Total Bilirubin: 1.2 mg/dL (ref 0.3–1.2)
Total Protein: 7.9 g/dL (ref 6.5–8.1)

## 2019-10-27 LAB — CBC WITH DIFFERENTIAL/PLATELET
Abs Immature Granulocytes: 0.04 10*3/uL (ref 0.00–0.07)
Basophils Absolute: 0 10*3/uL (ref 0.0–0.1)
Basophils Relative: 0 %
Eosinophils Absolute: 0.1 10*3/uL (ref 0.0–0.5)
Eosinophils Relative: 1 %
HCT: 43.5 % (ref 36.0–46.0)
Hemoglobin: 13.8 g/dL (ref 12.0–15.0)
Immature Granulocytes: 0 %
Lymphocytes Relative: 48 %
Lymphs Abs: 4.3 10*3/uL — ABNORMAL HIGH (ref 0.7–4.0)
MCH: 31.2 pg (ref 26.0–34.0)
MCHC: 31.7 g/dL (ref 30.0–36.0)
MCV: 98.2 fL (ref 80.0–100.0)
Monocytes Absolute: 0.4 10*3/uL (ref 0.1–1.0)
Monocytes Relative: 5 %
Neutro Abs: 4.1 10*3/uL (ref 1.7–7.7)
Neutrophils Relative %: 46 %
Platelets: 198 10*3/uL (ref 150–400)
RBC: 4.43 MIL/uL (ref 3.87–5.11)
RDW: 13.2 % (ref 11.5–15.5)
WBC: 8.9 10*3/uL (ref 4.0–10.5)
nRBC: 0.2 % (ref 0.0–0.2)

## 2019-10-27 LAB — TROPONIN I (HIGH SENSITIVITY): Troponin I (High Sensitivity): 10 ng/L (ref <18)

## 2019-10-27 LAB — POC SARS CORONAVIRUS 2 AG -  ED: SARS Coronavirus 2 Ag: NEGATIVE

## 2019-10-27 LAB — BRAIN NATRIURETIC PEPTIDE: B Natriuretic Peptide: 373 pg/mL — ABNORMAL HIGH (ref 0.0–100.0)

## 2019-10-27 MED ORDER — FUROSEMIDE 10 MG/ML IJ SOLN
40.0000 mg | Freq: Once | INTRAMUSCULAR | Status: AC
  Administered 2019-10-27: 40 mg via INTRAVENOUS
  Filled 2019-10-27: qty 4

## 2019-10-27 MED ORDER — NITROGLYCERIN IN D5W 200-5 MCG/ML-% IV SOLN
INTRAVENOUS | Status: AC
  Filled 2019-10-27: qty 250

## 2019-10-27 MED ORDER — NITROGLYCERIN IN D5W 200-5 MCG/ML-% IV SOLN
5.0000 ug/min | INTRAVENOUS | Status: DC
  Administered 2019-10-27: 100 ug/min via INTRAVENOUS

## 2019-10-27 NOTE — ED Triage Notes (Signed)
Patient brought in by [facility] for respiratory distress. Patient had sudden onset. No Hx of COPD.

## 2019-10-27 NOTE — ED Provider Notes (Signed)
Emergency Department Provider Note   I have reviewed the triage vital signs and the nursing notes.   HISTORY  Chief Complaint Respiratory Distress   HPI Felicia Chavez is a 59 y.o. female with medical problems documented below who presents to the emergency department today with acute onset of shortness of breath.  Patient states she has been in her normal state of health until earlier tonight she had a sudden feeling of suffocation and not been able to breathe very well.  EMS was called they were on CPAP and she arrived here 90% pulse ox.   Patient was hypertensive and tachycardic with them.  She is not claiming chest pain with them.  Right now her history is limited by acuity and interventions necessary to stave off intubation.  She denies fever, productive cough, sick contacts, chest pain, leg swelling.  She states that she does take some type of fluid pill.  She also states that she has been compliant.  LEVEL V CAVEAT SECONDARY TO ACUITY AND INTERVENTIONS NEEDED   Past Medical History:  Diagnosis Date  . Arthritis   . Diabetes mellitus without complication ([place])   . GERD (gastroesophageal reflux disease)   . Hypertension   . Stroke [facility])     Patient Active Problem List   Diagnosis Date Noted  . Type 2 diabetes mellitus without complication, without long-term current use of insulin ([place]) 07/14/2016  . Cigarette smoker 04/15/2016  . Pulmonary nodule 12/18/2015  . Tobacco abuse 12/18/2015  . Stenosis of artery ([place])   . Stroke ([place])   . HTN (hypertension) 09/20/2012  . Diabetes ([place]) 09/20/2012  . HLD (hyperlipidemia) 09/20/2012  . H/O: stroke with residual effects 09/20/2012  . Facial droop 09/20/2012    Past Surgical History:  Procedure Laterality Date  . RADIOLOGY WITH ANESTHESIA N/A 10/18/2012   Procedure: RADIOLOGY WITH ANESTHESIA;  Surgeon: Rob Hickman, MD;  Location: [place] [facility];  Service: Radiology;  Laterality: N/A;  . TUBAL LIGATION      Current  Outpatient Rx  . Order #: [id number] Class: Print  . Order #: [id number] Class: Print  . Order #: [id number] Class: Historical Med  . Order #: [id number] Class: Print  . Order #: [id number] Class: Print  . Order #: [id number] Class: Print  . Order #: [id number] Class: Historical Med  . Order #: [id number] Class: Print  . Order #: [id number] Class: Historical Med  . Order #: [id number] Class: Print  . Order #: [id number] Class: Historical Med    Allergies Lisinopril  Family History  Problem Relation Age of Onset  . Hypertension Mother     Social History Social History   Tobacco Use  . Smoking status: Former Smoker    Packs/day: 0.50    Years: 4.00    Pack years: 2.00    Types: Cigarettes    Quit date: 04/15/2016    Years since quitting: 3.5  . Smokeless tobacco: Never Used  . Tobacco comment: quit since 03/2016  Substance Use Topics  . Alcohol use: Yes    Comment: rarely  . Drug use: No    Review of Systems   LEVEL V CAVEAT SECONDARY TO ACUITY AND INTERVENTIONS NEEDED ____________________________________________   PHYSICAL EXAM:  VITAL SIGNS: ED Triage Vitals  Enc Vitals Group     BP 10/27/19 2314 (!) 197/115     Pulse Rate 10/27/19 2314 (!) 120     Resp 10/27/19 2314 (!) 33     Temp --      Temp src --  SpO2 10/27/19 2312 94 %     Weight 10/27/19 2312 180 lb (81.6 kg)     Height 10/27/19 2312 5' 6" (1.676 m)    Constitutional: Alert and oriented. ACUTE resp. Distress. Eyes: Conjunctivae are normal. PERRL. EOMI. Head: Atraumatic. Nose: No congestion/rhinnorhea. Mouth/Throat: Mucous membranes are moist.  Oropharynx not examined Neck: No stridor.  No meningeal signs.   Cardiovascular: hypertensive, tachycardic rate, regular rhythm. Good peripheral circulation. Grossly normal heart sounds. JVD present  Respiratory: Tachypneic.  Supracostal retractions.  Moderate respiratory distress.  Crackles left greater than right bases.  Diminished breath sounds throughout.  Wheezing in  apices. Gastrointestinal: Soft and nontender. No distention.  Musculoskeletal: No lower extremity tenderness nor edema. No gross deformities of extremities. Neurologic:  Normal speech and language. No gross focal neurologic deficits are appreciated.  Skin:  Skin is warm, diaphoretuc but intact. No rash noted.  ____________________________________________   LABS (all labs ordered are listed, but only abnormal results are displayed)  Labs Reviewed  CBC WITH DIFFERENTIAL/PLATELET - Abnormal; Notable for the following components:      Result Value   Lymphs Abs 4.3 (*)    All other components within normal limits  URINE CULTURE  COMPREHENSIVE METABOLIC PANEL  BRAIN NATRIURETIC PEPTIDE  URINALYSIS, COMPLETE (UACMP) WITH MICROSCOPIC  POC SARS CORONAVIRUS 2 AG -  ED  POC URINE PREG, ED  TROPONIN I (HIGH SENSITIVITY)   ____________________________________________  EKG   EKG Interpretation  Date/Time:    Ventricular Rate:    PR Interval:    QRS Duration:   QT Interval:    QTC Calculation:   R Axis:     Text Interpretation:          My ECG Read Indication:sob EKG was personally contemporaneously reviewed by myself. Rate: 119 PR Interval: 88 QRS duration: 72 QT/QTC: 412/580 Axis: likely RAD EKG: sinus tachycardia, prolonged QT interval.  ____________________________________________  RADIOLOGY  DG Chest Portable 1 View  Result Date: 10/27/2019 CLINICAL DATA:  Respiratory distress EXAM: PORTABLE CHEST 1 VIEW COMPARISON:  09/20/2012 FINDINGS: Cardiac shadow is within normal limits. Diffuse patchy opacities are noted throughout both lungs consistent with atypical pneumonia. Small effusions are noted bilaterally. No bony abnormality is seen. IMPRESSION: Patchy opacities bilaterally. Correlate with pending COVID-19 testing. Electronically Signed   By: Inez Catalina M.D.   On: 10/27/2019 23:18    ____________________________________________   PROCEDURES  Procedure(s)  performed:   .Critical Care Performed by: Merrily Pew, MD Authorized by: Merrily Pew, MD   Critical care provider statement:    Critical care time (minutes):  45   Critical care was necessary to treat or prevent imminent or life-threatening deterioration of the following conditions:  Cardiac failure, circulatory failure and respiratory failure   Critical care was time spent personally by me on the following activities:  Discussions with consultants, evaluation of patient's response to treatment, examination of patient, ordering and performing treatments and interventions, ordering and review of laboratory studies, ordering and review of radiographic studies, pulse oximetry, re-evaluation of patient's condition, obtaining history from patient or surrogate and review of old charts     ____________________________________________   INITIAL IMPRESSION / [place] / ED COURSE  Patient's clinical picture is consistent with likely acute pulmonary edema.  Consider possible infectious causes with asymmetric lung sounds however this came on acutely with a nonproductive cough and no fever.  We'll do the rapid Covid but secondary to the high likelihood of this being cardiac in nature will start BiPAP.  Will start nitroglycerin with rapid titrations.  Critically ill but not requiring intubation at this time.  Clinical Course as of Oct 27 40  Thu Oct 27, 2019  2325 Could be suggestive of covid, however in current clinical setting, more concerned for acute pulmonary edema. Will continue bipap/NTG infusion with rapid titrations. Lasix ordered. Continuous ecg monitoring. Q70mBP measurements.   DG Chest Portable 1 View [JM]  Fri Oct 28, 2019  0020 Consistent with pulm edema  B Natriuretic Peptide(!): 373.0 [JM]  0021 Will check 6-24 hour as per protocol, but suspect this isnot infectious  POC SARS Coronavirus 2 Ag-ED - Nasal Swab (BD Veritor Kit) [JM]  0021 Unremarkable kidney function     Creatinine: 0.73 [JM]  0021 Early in course, but with normal ecg, unlikely to be MI related.  Will continue 12 lead ecg monitoring for rhythm monitoring in the setting of heart failure of undetermined etiology and severity.   Troponin I (High Sensitivity): 10 [JM]  0023 Normal but with lasix, will give some runs of K and Mg (prolonged QT)  Potassium: 3.7 [JM]    Clinical Course User Index [JM] Mesner, JCorene Cornea MD    Pertinent labs & imaging results that were available during my care of the patient were reviewed by me and considered in my medical decision making (see chart for details).  ____________________________________________  FINAL CLINICAL IMPRESSION(S) / ED DIAGNOSES  Final diagnoses:  None     MEDICATIONS GIVEN DURING THIS VISIT:  Medications  nitroGLYCERIN 50 mg in dextrose 5 % 250 mL (0.2 mg/mL) infusion (150 mcg/min Intravenous Rate/Dose Change 10/27/19 2329)  furosemide (LASIX) injection 40 mg (40 mg Intravenous Given 10/27/19 2329)     NEW OUTPATIENT MEDICATIONS STARTED DURING THIS VISIT:  New Prescriptions   No medications on file    Note:  This note was prepared with assistance of Dragon voice recognition software. Occasional wrong-word or sound-a-like substitutions may have occurred due to the inherent limitations of voice recognition software.   Mesner, JCorene Cornea MD 10/28/19 [id number]

## 2019-10-27 NOTE — ED Triage Notes (Signed)
Patient is on C-Pap

## 2019-10-28 ENCOUNTER — Observation Stay (HOSPITAL_COMMUNITY): Payer: Medicare Other

## 2019-10-28 ENCOUNTER — Encounter (HOSPITAL_COMMUNITY): Payer: Self-pay | Admitting: Internal Medicine

## 2019-10-28 ENCOUNTER — Observation Stay (HOSPITAL_BASED_OUTPATIENT_CLINIC_OR_DEPARTMENT_OTHER): Payer: Medicare Other

## 2019-10-28 DIAGNOSIS — J9601 Acute respiratory failure with hypoxia: Secondary | ICD-10-CM | POA: Diagnosis present

## 2019-10-28 DIAGNOSIS — R000 Tachycardia, unspecified: Secondary | ICD-10-CM | POA: Diagnosis present

## 2019-10-28 DIAGNOSIS — I340 Nonrheumatic mitral (valve) insufficiency: Secondary | ICD-10-CM | POA: Diagnosis not present

## 2019-10-28 DIAGNOSIS — J189 Pneumonia, unspecified organism: Secondary | ICD-10-CM | POA: Diagnosis present

## 2019-10-28 DIAGNOSIS — I509 Heart failure, unspecified: Secondary | ICD-10-CM

## 2019-10-28 LAB — URINALYSIS, COMPLETE (UACMP) WITH MICROSCOPIC
Bacteria, UA: NONE SEEN
Bilirubin Urine: NEGATIVE
Glucose, UA: >=500 mg/dL — AB
Hgb urine dipstick: NEGATIVE
Ketones, ur: NEGATIVE mg/dL
Leukocytes,Ua: NEGATIVE
Nitrite: NEGATIVE
Protein, ur: NEGATIVE mg/dL
Specific Gravity, Urine: 1.01 (ref 1.005–1.030)
pH: 6 (ref 5.0–8.0)

## 2019-10-28 LAB — HIV ANTIBODY (ROUTINE TESTING W REFLEX): HIV Screen 4th Generation wRfx: NONREACTIVE

## 2019-10-28 LAB — RESPIRATORY PANEL BY RT PCR (FLU A&B, COVID)
Influenza A by PCR: NEGATIVE
Influenza B by PCR: NEGATIVE
SARS Coronavirus 2 by RT PCR: NEGATIVE

## 2019-10-28 LAB — ECHOCARDIOGRAM COMPLETE
Height: 63 in
Weight: 2814.83 oz

## 2019-10-28 LAB — POC URINE PREG, ED: Preg Test, Ur: NEGATIVE

## 2019-10-28 LAB — GLUCOSE, CAPILLARY
Glucose-Capillary: 253 mg/dL — ABNORMAL HIGH (ref 70–99)
Glucose-Capillary: 262 mg/dL — ABNORMAL HIGH (ref 70–99)

## 2019-10-28 LAB — HEMOGLOBIN A1C
Hgb A1c MFr Bld: 10.3 % — ABNORMAL HIGH (ref 4.8–5.6)
Mean Plasma Glucose: 248.91 mg/dL

## 2019-10-28 LAB — MAGNESIUM: Magnesium: 2.1 mg/dL (ref 1.7–2.4)

## 2019-10-28 LAB — TROPONIN I (HIGH SENSITIVITY): Troponin I (High Sensitivity): 12 ng/L (ref <18)

## 2019-10-28 MED ORDER — IPRATROPIUM-ALBUTEROL 0.5-2.5 (3) MG/3ML IN SOLN
3.0000 mL | Freq: Four times a day (QID) | RESPIRATORY_TRACT | Status: DC
  Administered 2019-10-28: 3 mL via RESPIRATORY_TRACT
  Filled 2019-10-28: qty 3

## 2019-10-28 MED ORDER — ORAL CARE MOUTH RINSE
15.0000 mL | Freq: Two times a day (BID) | OROMUCOSAL | Status: DC
  Administered 2019-10-28 (×2): 15 mL via OROMUCOSAL

## 2019-10-28 MED ORDER — CLOPIDOGREL BISULFATE 75 MG PO TABS
75.0000 mg | ORAL_TABLET | Freq: Every day | ORAL | Status: DC
  Administered 2019-10-28: 75 mg via ORAL
  Filled 2019-10-28: qty 1

## 2019-10-28 MED ORDER — GLIPIZIDE ER 5 MG PO TB24: 5.0000 mg | ORAL_TABLET | Freq: Every day | ORAL | 2 refills | Status: DC

## 2019-10-28 MED ORDER — ACETAMINOPHEN 325 MG PO TABS: 650.0000 mg | ORAL_TABLET | ORAL | Status: DC | PRN

## 2019-10-28 MED ORDER — INSULIN ASPART 100 UNIT/ML ~~LOC~~ SOLN: 0.0000 [IU] | Freq: Every day | SUBCUTANEOUS | Status: DC

## 2019-10-28 MED ORDER — PRAVASTATIN SODIUM 40 MG PO TABS: 40.0000 mg | ORAL_TABLET | Freq: Every day | ORAL | 2 refills | Status: DC

## 2019-10-28 MED ORDER — ASPIRIN EC 81 MG PO TBEC: 81.0000 mg | DELAYED_RELEASE_TABLET | Freq: Every day | ORAL | 2 refills | Status: DC

## 2019-10-28 MED ORDER — SODIUM CHLORIDE 0.9 % IV SOLN
1.0000 g | INTRAVENOUS | Status: DC
  Administered 2019-10-28: 1 g via INTRAVENOUS
  Filled 2019-10-28: qty 10

## 2019-10-28 MED ORDER — CLOPIDOGREL BISULFATE 75 MG PO TABS: 75.0000 mg | ORAL_TABLET | Freq: Every day | ORAL | 2 refills | Status: DC

## 2019-10-28 MED ORDER — GLIPIZIDE ER 5 MG PO TB24: 5.0000 mg | ORAL_TABLET | Freq: Every day | ORAL | 2 refills | Status: AC

## 2019-10-28 MED ORDER — FARXIGA 10 MG PO TABS: 10.0000 mg | ORAL_TABLET | Freq: Every day | ORAL | 3 refills | Status: AC

## 2019-10-28 MED ORDER — FARXIGA 10 MG PO TABS: 10.0000 mg | ORAL_TABLET | Freq: Every day | ORAL | 3 refills | Status: DC

## 2019-10-28 MED ORDER — SODIUM CHLORIDE 0.9 % IV SOLN: 250.0000 mL | INTRAVENOUS | Status: DC | PRN

## 2019-10-28 MED ORDER — MAGNESIUM SULFATE 2 GM/50ML IV SOLN
2.0000 g | Freq: Once | INTRAVENOUS | Status: AC
  Administered 2019-10-28: 2 g via INTRAVENOUS
  Filled 2019-10-28: qty 50

## 2019-10-28 MED ORDER — CANAGLIFLOZIN 100 MG PO TABS
100.0000 mg | ORAL_TABLET | Freq: Every day | ORAL | Status: DC
  Administered 2019-10-28: 100 mg via ORAL
  Filled 2019-10-28 (×4): qty 1

## 2019-10-28 MED ORDER — CLOPIDOGREL BISULFATE 75 MG PO TABS: 75.0000 mg | ORAL_TABLET | Freq: Every day | ORAL | 2 refills | Status: AC

## 2019-10-28 MED ORDER — METFORMIN HCL 1000 MG PO TABS: 1000.0000 mg | ORAL_TABLET | Freq: Two times a day (BID) | ORAL | 2 refills | Status: DC

## 2019-10-28 MED ORDER — HYDROCHLOROTHIAZIDE 25 MG PO TABS
25.0000 mg | ORAL_TABLET | Freq: Every day | ORAL | Status: DC
  Administered 2019-10-28: 25 mg via ORAL
  Filled 2019-10-28: qty 1

## 2019-10-28 MED ORDER — PIOGLITAZONE HCL 15 MG PO TABS: 15.0000 mg | ORAL_TABLET | Freq: Every day | ORAL | 11 refills | Status: DC

## 2019-10-28 MED ORDER — INSULIN ASPART 100 UNIT/ML ~~LOC~~ SOLN
0.0000 [IU] | SUBCUTANEOUS | Status: DC
  Administered 2019-10-28: 11 [IU] via SUBCUTANEOUS

## 2019-10-28 MED ORDER — ONDANSETRON HCL 4 MG/2ML IJ SOLN: 4.0000 mg | Freq: Four times a day (QID) | INTRAMUSCULAR | Status: DC | PRN

## 2019-10-28 MED ORDER — HYDROCHLOROTHIAZIDE 25 MG PO TABS: 25.0000 mg | ORAL_TABLET | Freq: Every day | ORAL | 3 refills | Status: DC

## 2019-10-28 MED ORDER — ENOXAPARIN SODIUM 40 MG/0.4ML ~~LOC~~ SOLN
40.0000 mg | Freq: Every day | SUBCUTANEOUS | Status: DC
  Administered 2019-10-28: 40 mg via SUBCUTANEOUS
  Filled 2019-10-28: qty 0.4

## 2019-10-28 MED ORDER — ASPIRIN EC 81 MG PO TBEC: 81.0000 mg | DELAYED_RELEASE_TABLET | Freq: Every day | ORAL | 2 refills | Status: AC

## 2019-10-28 MED ORDER — INSULIN ASPART 100 UNIT/ML ~~LOC~~ SOLN: 0.0000 [IU] | Freq: Three times a day (TID) | SUBCUTANEOUS | Status: DC

## 2019-10-28 MED ORDER — GABAPENTIN 100 MG PO CAPS
100.0000 mg | ORAL_CAPSULE | Freq: Three times a day (TID) | ORAL | Status: DC
  Administered 2019-10-28: 100 mg via ORAL
  Filled 2019-10-28: qty 1

## 2019-10-28 MED ORDER — SODIUM CHLORIDE 0.9% FLUSH
3.0000 mL | Freq: Two times a day (BID) | INTRAVENOUS | Status: DC
  Administered 2019-10-28 (×2): 3 mL via INTRAVENOUS

## 2019-10-28 MED ORDER — SODIUM CHLORIDE 0.9 % IV SOLN
500.0000 mg | INTRAVENOUS | Status: DC
  Administered 2019-10-28: 500 mg via INTRAVENOUS
  Filled 2019-10-28: qty 500

## 2019-10-28 MED ORDER — GLIPIZIDE ER 5 MG PO TB24
5.0000 mg | ORAL_TABLET | Freq: Every day | ORAL | Status: DC
  Filled 2019-10-28: qty 1

## 2019-10-28 MED ORDER — IPRATROPIUM-ALBUTEROL 0.5-2.5 (3) MG/3ML IN SOLN: 3.0000 mL | RESPIRATORY_TRACT | Status: DC | PRN

## 2019-10-28 MED ORDER — PRAVASTATIN SODIUM 40 MG PO TABS
40.0000 mg | ORAL_TABLET | Freq: Every day | ORAL | Status: DC
  Administered 2019-10-28: 40 mg via ORAL
  Filled 2019-10-28: qty 1

## 2019-10-28 MED ORDER — GABAPENTIN 100 MG PO CAPS: 100.0000 mg | ORAL_CAPSULE | Freq: Three times a day (TID) | ORAL | 2 refills | Status: AC

## 2019-10-28 MED ORDER — METFORMIN HCL 500 MG PO TABS
1000.0000 mg | ORAL_TABLET | Freq: Two times a day (BID) | ORAL | Status: DC
  Administered 2019-10-28: 1000 mg via ORAL
  Filled 2019-10-28: qty 2

## 2019-10-28 MED ORDER — GABAPENTIN 100 MG PO CAPS: 100.0000 mg | ORAL_CAPSULE | Freq: Three times a day (TID) | ORAL | 2 refills | Status: DC

## 2019-10-28 MED ORDER — JANUMET 50-1000 MG PO TABS: 1.0000 | ORAL_TABLET | Freq: Two times a day (BID) | ORAL | 3 refills | Status: AC

## 2019-10-28 MED ORDER — POTASSIUM CHLORIDE 10 MEQ/100ML IV SOLN
10.0000 meq | Freq: Once | INTRAVENOUS | Status: AC
  Administered 2019-10-28: 10 meq via INTRAVENOUS
  Filled 2019-10-28: qty 100

## 2019-10-28 MED ORDER — ASPIRIN EC 81 MG PO TBEC
81.0000 mg | DELAYED_RELEASE_TABLET | Freq: Every day | ORAL | Status: DC
  Administered 2019-10-28: 81 mg via ORAL
  Filled 2019-10-28: qty 1

## 2019-10-28 MED ORDER — SITAGLIPTIN PHOS-METFORMIN HCL 50-1000 MG PO TABS: 1.0000 | ORAL_TABLET | Freq: Two times a day (BID) | ORAL | Status: DC

## 2019-10-28 MED ORDER — SODIUM CHLORIDE 0.9% FLUSH: 3.0000 mL | INTRAVENOUS | Status: DC | PRN

## 2019-10-28 MED ORDER — JANUMET 50-1000 MG PO TABS: 1.0000 | ORAL_TABLET | Freq: Two times a day (BID) | ORAL | 3 refills | Status: DC

## 2019-10-28 MED ORDER — LINAGLIPTIN 5 MG PO TABS
5.0000 mg | ORAL_TABLET | Freq: Two times a day (BID) | ORAL | Status: DC
  Administered 2019-10-28: 5 mg via ORAL

## 2019-10-28 NOTE — Care Management Obs Status (Signed)
MEDICARE OBSERVATION STATUS NOTIFICATION   Patient Details  Name: Felicia Chavez MRN: [id number] Date of Birth: 1960-09-26   Medicare Observation Status Notification Given:  Yes    Ashden Sonnenberg Sherryle Lis, LCSW 10/28/2019, 10:55 AM

## 2019-10-28 NOTE — H&P (Signed)
History and Physical    Felicia Chavez DHR:[id number] DOB: Dec 28, 1960 DOA: 10/27/2019  PCP: Gevena Mart, DO (Confirm with patient/family/NH records and if not entered, this has to be entered at [facility] point of entry)  Patient coming from: Home  I have personally briefly reviewed patient's old medical records in [place]  Chief Complaint: Shortness of breath  HPI: Felicia Chavez is a 59 y.o. female with medical history significant of hypertension, hyperlipidemia, diabetes mellitus and tobacco abuse presented to ED and of worsening shortness of breath.  Patient reported that the problem started suddenly when she started feeling difficulty with breathing and had a feeling that she is not getting enough oxygen.  Patient states that she is also having episodic cough for the last 3 days.  Patient further reported that she is feeling that her heart was racing fast and also having bad exertional dyspnea.  Patient otherwise denies fever, chills, chest pain, nausea, vomiting, abdominal pain and leg swelling.  Patient also reported that she is on some type of diuretic but she does not remember the name of that medication.  Patient states that due to her worsening shortness of breath she called 911 for help.  When the EMS reached their patient was in severe respiratory distress and was placed on CPAP.  ED Course: On arrival to the ED patient was in severe respiratory distress and her oxygen saturation was 90% and was placed on BiPAP.  Blood pressure was 107/115, heart rate 120 and respiratory rate 33.  Blood work did not show any leukocytosis and showed blood glucose of 483, transaminitis and BNP of 373.  Chest x-ray showed patchy bilateral opacities and also some pulmonary congestion as interpreted by myself.  EKG showed sinus tachycardia and prolonged QT interval but negative for acute ischemic changes.  Patient was started on nitroglycerin drip in the ED and also given IV Lasix 40 mg.  Patient will be  admitted to stepdown for further management.  Review of Systems: As per HPI otherwise 10 point review of systems negative.    Past Medical History:  Diagnosis Date  . Arthritis   . Diabetes mellitus without complication ([place])   . GERD (gastroesophageal reflux disease)   . Hypertension   . Stroke [facility])     Past Surgical History:  Procedure Laterality Date  . RADIOLOGY WITH ANESTHESIA N/A 10/18/2012   Procedure: RADIOLOGY WITH ANESTHESIA;  Surgeon: Rob Hickman, MD;  Location: [place];  Service: Radiology;  Laterality: N/A;  . TUBAL LIGATION       reports that she quit smoking about 3 years ago. Her smoking use included cigarettes. She has a 2.00 pack-year smoking history. She has never used smokeless tobacco. She reports current alcohol use. She reports that she does not use drugs.  Allergies  Allergen Reactions  . Lisinopril Swelling    Facial swelling    Family History  Problem Relation Age of Onset  . Hypertension Mother      Prior to Admission medications   Medication Sig Start Date End Date Taking? Authorizing Provider  aspirin EC 81 MG tablet Take 1 tablet (81 mg total) by mouth daily. 11/04/12   Thurnell Lose, MD  clopidogrel (PLAVIX) 75 MG tablet Take 1 tablet (75 mg total) by mouth daily with breakfast. 11/04/12   Thurnell Lose, MD  dapagliflozin propanediol (FARXIGA) 10 MG TABS tablet Take 10 mg by mouth daily.    [provider]  gabapentin (NEURONTIN) 100 MG  capsule Take 1 capsule (100 mg total) by mouth 3 (three) times daily. 11/04/12   Thurnell Lose, MD  glipiZIDE (GLUCOTROL XL) 5 MG 24 hr tablet Take 1 tablet (5 mg total) by mouth daily. 11/04/12   Thurnell Lose, MD  glucose monitoring kit (FREESTYLE) monitoring kit 1 each by Does not apply route 4 (four) times daily - after meals and at bedtime. 1 month Diabetic Testing Supplies for QAC-QHS accuchecks. 11/04/12   Thurnell Lose, MD  hydrochlorothiazide (HYDRODIURIL) 25 MG tablet Take  25 mg by mouth daily.    [provider]  metFORMIN (GLUCOPHAGE) 1000 MG tablet Take 1 tablet (1,000 mg total) by mouth 2 (two) times daily with a meal. Please start on 09/24/2012 11/04/12   Thurnell Lose, MD  pioglitazone (ACTOS) 15 MG tablet Take by mouth. 12/01/16 12/01/17  [provider]  pravastatin (PRAVACHOL) 40 MG tablet Take 1 tablet (40 mg total) by mouth daily. 11/04/12   Thurnell Lose, MD  sitaGLIPtin-metformin (JANUMET) 50-1000 MG tablet TAKE (1) TABLET BY MOUTH TWICE A DAY WITH MEALS (BREAKFAST AND SUPPER) 11/19/16   [provider]    Physical Exam: Vitals:   10/28/19 0150 10/28/19 0155 10/28/19 0218 10/28/19 0300  BP: 124/79 124/74 (!) 147/79   Pulse: (!) 103 (!) 102 (!) 108 (!) 102  Resp: (!) 23 20 (!) 22 (!) 24  Temp:   98 F (36.7 C)   TempSrc:   Oral   SpO2: 98% 99% 98% 95%  Weight:   79.8 kg   Height:   5' 3"  (1.6 m)     Constitutional: NAD, calm, comfortable Vitals:   10/28/19 0150 10/28/19 0155 10/28/19 0218 10/28/19 0300  BP: 124/79 124/74 (!) 147/79   Pulse: (!) 103 (!) 102 (!) 108 (!) 102  Resp: (!) 23 20 (!) 22 (!) 24  Temp:   98 F (36.7 C)   TempSrc:   Oral   SpO2: 98% 99% 98% 95%  Weight:   79.8 kg   Height:   5' 3"  (1.6 m)     General: Patient is a 59 year old African-American female, in mild respiratory distress and on BiPAP at this time. Eyes: PERRL, lids and conjunctivae normal ENMT: Mucous membranes are moist. Posterior pharynx clear of any exudate or lesions.Normal dentition.  Neck: normal, supple, no masses, no thyromegaly Respiratory: Patient is on BiPAP.  Diminished breath sounds with crackles in bilateral lower lobes.  Mild wheezing in apices appreciated on auscultation.  Normal respiratory effort. No accessory muscle use.  Cardiovascular: Regular rate and rhythm, no murmurs / rubs / gallops. No extremity edema. 2+ pedal pulses. No carotid bruits.  Abdomen: no tenderness, no masses palpated. No  hepatosplenomegaly. Bowel sounds positive.  Musculoskeletal: no clubbing / cyanosis. No joint deformity upper and lower extremities. Good ROM, no contractures. Normal muscle tone.  Skin: no rashes, lesions, ulcers. No induration Neurologic: CN 2-12 grossly intact. Sensation intact, DTR normal. Strength 5/5 in all 4.  Psychiatric: Normal judgment and insight. Alert and oriented x 3. Normal mood.   Labs on Admission: I have personally reviewed following labs and imaging studies  CBC: Recent Labs  Lab 10/27/19 2313  WBC 8.9  NEUTROABS 4.1  HGB 13.8  HCT 43.5  MCV 98.2  PLT 517   Basic Metabolic Panel: Recent Labs  Lab 10/27/19 2313  NA 133*  K 3.7  CL 100  CO2 23  GLUCOSE 483*  BUN 11  CREATININE 0.73  CALCIUM 8.5*  MG 2.1   GFR: Estimated Creatinine Clearance: 76.7 mL/min (by C-G formula based on SCr of 0.73 mg/dL). Liver Function Tests: Recent Labs  Lab 10/27/19 2313  AST 91*  ALT 92*  ALKPHOS 108  BILITOT 1.2  PROT 7.9  ALBUMIN 4.1   No results for input(s): LIPASE, AMYLASE in the last 168 hours. No results for input(s): AMMONIA in the last 168 hours. Coagulation Profile: No results for input(s): INR, PROTIME in the last 168 hours. Cardiac Enzymes: No results for input(s): CKTOTAL, CKMB, CKMBINDEX, TROPONINI in the last 168 hours. BNP (last 3 results) No results for input(s): PROBNP in the last 8760 hours. HbA1C: No results for input(s): HGBA1C in the last 72 hours. CBG: No results for input(s): GLUCAP in the last 168 hours. Lipid Profile: No results for input(s): CHOL, HDL, LDLCALC, TRIG, CHOLHDL, LDLDIRECT in the last 72 hours. Thyroid Function Tests: No results for input(s): TSH, T4TOTAL, FREET4, T3FREE, THYROIDAB in the last 72 hours. Anemia Panel: No results for input(s): VITAMINB12, FOLATE, FERRITIN, TIBC, IRON, RETICCTPCT in the last 72 hours. Urine analysis:    Component Value Date/Time   COLORURINE STRAW (A) 10/28/2019 0015   APPEARANCEUR  CLEAR 10/28/2019 0015   APPEARANCEUR Clear 01/31/2012 1221   LABSPEC 1.010 10/28/2019 0015   LABSPEC 1.009 01/31/2012 1221   PHURINE 6.0 10/28/2019 0015   GLUCOSEU >=500 (A) 10/28/2019 0015   GLUCOSEU >=500 01/31/2012 1221   HGBUR NEGATIVE 10/28/2019 0015   BILIRUBINUR NEGATIVE 10/28/2019 0015   BILIRUBINUR Negative 01/31/2012 1221   KETONESUR NEGATIVE 10/28/2019 0015   PROTEINUR NEGATIVE 10/28/2019 0015   NITRITE NEGATIVE 10/28/2019 0015   LEUKOCYTESUR NEGATIVE 10/28/2019 0015   LEUKOCYTESUR Negative 01/31/2012 1221    Radiological Exams on Admission: DG Chest Portable 1 View  Result Date: 10/27/2019 CLINICAL DATA:  Respiratory distress EXAM: PORTABLE CHEST 1 VIEW COMPARISON:  09/20/2012 FINDINGS: Cardiac shadow is within normal limits. Diffuse patchy opacities are noted throughout both lungs consistent with atypical pneumonia. Small effusions are noted bilaterally. No bony abnormality is seen. IMPRESSION: Patchy opacities bilaterally. Correlate with pending COVID-19 testing. Electronically Signed   By: Inez Catalina M.D.   On: 10/27/2019 23:18      Assessment/Plan Principal Problem:    Acute respiratory failure with hypoxia (HCC) Patient is having acute hypoxic respiratory failure most likely secondary to acute CHF exacerbation and pneumonia.  Patient is placed on BiPAP at this time and the goal is to de-escalate to nasal cannula by maintaining oxygen saturation within normal limits. Patient is given IV Lasix 40 mg in the ED for pulmonary congestion. IV ceftriaxone and IV azithromycin ordered. DuoNeb scheduled and as needed ordered for shortness of breath.  Continue to monitor.  Active Problems:    HTN (hypertension) Continue nitro glycine at this time.    Diabetes (HCC) Moderate dose sliding scale insulin ordered. Blood glucose monitoring and hypoglycemic protocol in place.    Acute exacerbation of CHF (congestive heart failure) (HCC) IV Lasix 40 mg given. Daily weight  with intake and output monitoring ordered. Echocardiogram ordered. Patient is advised about using low salt in her diet.    Multifocal pneumonia IV ceftriaxone and IV azithromycin ordered. Covid antigen and PCR came back negative    Tachycardia Tachycardia is most probably secondary to anxiety and respiratory distress.  Tachycardia is improved at this time.  Continue to monitor.   DVT prophylaxis : Lovenox  Code Status: Full code   Consults called:   Admission status: Inpatient/stepdown   Joselyn Arrow  Humphrey Rolls MD [facility] Pager [phone]   If 7PM-7AM, please contact night-coverage [email or web address] Password   10/28/2019, 4:08 AM

## 2019-10-28 NOTE — Care Management CC44 (Signed)
Condition Code 44 Documentation Completed  Patient Details  Name: Felicia Chavez MRN: [id number] Date of Birth: 06-22-1961   Condition Code 44 given:  Yes Patient signature on Condition Code 44 notice:  Yes Documentation of 2 MD's agreement:  Yes Code 44 added to claim:  Yes    [name] Sherryle Lis, LCSW 10/28/2019, 10:50 AM

## 2019-10-28 NOTE — Progress Notes (Addendum)
Discharge instructions provided to both patient and son Maurine Minister). Both verbalized understanding of discharge instructions and had no further questions. Son states he will pick up patient between 11:30 and 12. Son states he will pick up medications for patient, but need prescriptions to be sent to [facility] in [place]. Dr. Sherryll Burger notified and will change.

## 2019-10-28 NOTE — Progress Notes (Signed)
Pt ambulated around room and unit once without oxygen and desaturated to 87%, but came back up to 90% once at rest then remained around 94% when resting. Pt had no complaints of shortness of breath or any trouble breathing while ambulating. Dr. Sherryll Burger notified.

## 2019-10-28 NOTE — Progress Notes (Signed)
*  PRELIMINARY RESULTS* Echocardiogram 2D Echocardiogram has been performed.  Felicia Chavez 10/28/2019, 10:27 AM

## 2019-10-28 NOTE — Discharge Summary (Addendum)
Physician Discharge Summary  Felicia Chavez JEH:[id number] DOB: [birth year] DOA: 10/27/2019  PCP: Vista Lawman A, DO  Admit date: 10/27/2019  Discharge date: 10/28/2019  Admitted From:Home  Disposition:  Home  Recommendations for Outpatient Follow-up:  1. Follow up with PCP in 1-2 weeks 2. Please follow-up 2D echocardiogram study 3. Please obtain BMP/CBC in one week 4. All medications have been refilled as she had run out of medications 2-3 months ago.  Encouraged compliance and close follow-up with PCP. 5. Monitor home blood glucose as well as blood pressures  Home Health: None  Equipment/Devices: None  Discharge Condition: Stable  CODE STATUS: Full  Diet recommendation: Heart Healthy/carb modified  Brief/Interim Summary: Per HPI: Felicia Chavez is a 59 y.o. female with medical history significant of hypertension, hyperlipidemia, diabetes mellitus and tobacco abuse presented to ED and of worsening shortness of breath.  Patient reported that the problem started suddenly when she started feeling difficulty with breathing and had a feeling that she is not getting enough oxygen.  Patient states that she is also having episodic cough for the last 3 days.  Patient further reported that she is feeling that her heart was racing fast and also having bad exertional dyspnea.  Patient otherwise denies fever, chills, chest pain, nausea, vomiting, abdominal pain and leg swelling.  Patient also reported that she is on some type of diuretic but she does not remember the name of that medication.  Patient states that due to her worsening shortness of breath she called 911 for help.  When the EMS reached their patient was in severe respiratory distress and was placed on CPAP.  Patient was admitted with acute hypoxemic respiratory failure secondary to pulmonary edema and suspected CHF exacerbation.  She appears to be doing quite well and is very eager to go home.  She states that she has been noncompliant  with all her for her medications to include hydrochlorothiazide as well as diabetes medications over the last 2-3 months since she "ran out".  She has been weaned off of her oxygen and has no further difficulty with breathing, chest pain, or palpitations.  She has ambulated with no severe symptomatology and maintains her oxygen saturations adequately.  She will have 2D echocardiogram performed and will need close follow-up in the outpatient setting and further review of this study.  I have encouraged her to remain compliant with her medications which appears to be the main source of her admission.  It appears that her blood pressures have been very uncontrolled leading her into her heart failure/pulmonary edema.  She was also started on antibiotics due to concern for multifocal pneumonia, but I do not feel that this is the case and will discontinue antibiotics at this time.  All medications have been refilled and she will need close follow-up to her PCP in the next 1-2 weeks.  She states she will continue to follow home blood glucose carefully along with diet as her A1c during this admission was 10.3%.  Covid testing negative.  Discharge Diagnoses:  Principal Problem:   Acute respiratory failure with hypoxia (HCC) Active Problems:   HTN (hypertension)   Diabetes ([place])   Acute exacerbation of CHF (congestive heart failure) (HCC)   Multifocal pneumonia   Tachycardia  Principal discharge diagnosis: Acute hypoxemic respiratory failure secondary to pulmonary edema in the setting of uncontrolled hypertension.  Suspect CHF.  Discharge Instructions  Discharge Instructions    Diet - low sodium heart healthy   Complete by: As directed  Increase activity slowly   Complete by: As directed      Allergies as of 10/28/2019      Reactions   Lisinopril Swelling   Facial swelling      Medication List    STOP taking these medications   metFORMIN 1000 MG tablet Commonly known as: GLUCOPHAGE      TAKE these medications   aspirin EC 81 MG tablet Take 1 tablet (81 mg total) by mouth daily.   clopidogrel 75 MG tablet Commonly known as: PLAVIX Take 1 tablet (75 mg total) by mouth daily with breakfast.   Farxiga 10 MG Tabs tablet Generic drug: dapagliflozin propanediol Take 10 mg by mouth daily.   gabapentin 100 MG capsule Commonly known as: NEURONTIN Take 1 capsule (100 mg total) by mouth 3 (three) times daily.   glipiZIDE 5 MG 24 hr tablet Commonly known as: GLUCOTROL XL Take 1 tablet (5 mg total) by mouth daily.   glucose monitoring kit monitoring kit 1 each by Does not apply route 4 (four) times daily - after meals and at bedtime. 1 month Diabetic Testing Supplies for QAC-QHS accuchecks.   hydrochlorothiazide 25 MG tablet Commonly known as: HYDRODIURIL Take 1 tablet (25 mg total) by mouth daily.   Janumet 50-1000 MG tablet Generic drug: sitaGLIPtin-metformin Take 1 tablet by mouth 2 (two) times daily with a meal. What changed: See the new instructions.   pioglitazone 15 MG tablet Commonly known as: ACTOS Take 1 tablet (15 mg total) by mouth daily. What changed:   how much to take  when to take this   pravastatin 40 MG tablet Commonly known as: PRAVACHOL Take 1 tablet (40 mg total) by mouth daily.      Follow-up Information    Bonsu, Osei A, DO Follow up in 1 week(s).   Specialty: Internal Medicine Contact information: [address] [place] [address] [phone]          Allergies  Allergen Reactions  . Lisinopril Swelling    Facial swelling    Consultations:  None   Procedures/Studies: DG Chest Portable 1 View  Result Date: 10/27/2019 CLINICAL DATA:  Respiratory distress EXAM: PORTABLE CHEST 1 VIEW COMPARISON:  09/20/2012 FINDINGS: Cardiac shadow is within normal limits. Diffuse patchy opacities are noted throughout both lungs consistent with atypical pneumonia. Small effusions are noted bilaterally. No bony abnormality is  seen. IMPRESSION: Patchy opacities bilaterally. Correlate with pending COVID-19 testing. Electronically Signed   By: Inez Catalina M.D.   On: 10/27/2019 23:18    Discharge Exam: Vitals:   10/28/19 0855 10/28/19 0910  BP:    Pulse: (!) 102   Resp: (!) 32   Temp:    SpO2: 92% 93%   Vitals:   10/28/19 0820 10/28/19 0830 10/28/19 0855 10/28/19 0910  BP:      Pulse: (!) 110  (!) 102   Resp: (!) 22 (!) 23 (!) 32   Temp:      TempSrc:      SpO2: 96%  92% 93%  Weight:      Height:        General: Pt is alert, awake, not in acute distress, obese Cardiovascular: RRR, S1/S2 +, no rubs, no gallops Respiratory: CTA bilaterally, no wheezing, no rhonchi Abdominal: Soft, NT, ND, bowel sounds + Extremities: no edema, no cyanosis    The results of significant diagnostics from this hospitalization (including imaging, microbiology, ancillary and laboratory) are listed below for reference.     Microbiology: Recent  Results (from the past 240 hour(s))  Respiratory Panel by RT PCR (Flu A&B, Covid) - Nasopharyngeal Swab     Status: None   Collection Time: 10/28/19 12:23 AM   Specimen: Nasopharyngeal Swab  Result Value Ref Range Status   SARS Coronavirus 2 by RT PCR NEGATIVE NEGATIVE Final    Comment: (NOTE) SARS-CoV-2 target nucleic acids are NOT DETECTED. The SARS-CoV-2 RNA is generally detectable in upper respiratoy specimens during the acute phase of infection. The lowest concentration of SARS-CoV-2 viral copies this assay can detect is 131 copies/mL. A negative result does not preclude SARS-Cov-2 infection and should not be used as the sole basis for treatment or other patient management decisions. A negative result may occur with  improper specimen collection/handling, submission of specimen other than nasopharyngeal swab, presence of viral mutation(s) within the areas targeted by this assay, and inadequate number of viral copies (<131 copies/mL). A negative result must be combined  with clinical observations, patient history, and epidemiological information. The expected result is Negative. Fact Sheet for Patients:  PinkCheek.be Fact Sheet for Healthcare Providers:  GravelBags.it This test is not yet ap proved or cleared by the [place] FDA and  has been authorized for detection and/or diagnosis of SARS-CoV-2 by FDA under an Emergency Use Authorization (EUA). This EUA will remain  in effect (meaning this test can be used) for the duration of the COVID-19 declaration under Section 564(b)(1) of the Act, 21 U.S.C. section 360bbb-3(b)(1), unless the authorization is terminated or revoked sooner.    Influenza A by PCR NEGATIVE NEGATIVE Final   Influenza B by PCR NEGATIVE NEGATIVE Final    Comment: (NOTE) The Xpert Xpress SARS-CoV-2/FLU/RSV assay is intended as an aid in  the diagnosis of influenza from Nasopharyngeal swab specimens and  should not be used as a sole basis for treatment. Nasal washings and  aspirates are unacceptable for Xpert Xpress SARS-CoV-2/FLU/RSV  testing. Fact Sheet for Patients: PinkCheek.be Fact Sheet for Healthcare Providers: GravelBags.it This test is not yet approved or cleared by the [place] FDA and  has been authorized for detection and/or diagnosis of SARS-CoV-2 by  FDA under an Emergency Use Authorization (EUA). This EUA will remain  in effect (meaning this test can be used) for the duration of the  Covid-19 declaration under Section 564(b)(1) of the Act, 21  U.S.C. section 360bbb-3(b)(1), unless the authorization is  terminated or revoked. Performed at [facility], [address], [place], [place] [address]      Labs: BNP (last 3 results) Recent Labs    10/27/19 2313  BNP 626.9*   Basic Metabolic Panel: Recent Labs  Lab 10/27/19 2313  NA 133*  K 3.7  CL 100  CO2 23  GLUCOSE 483*  BUN 11   CREATININE 0.73  CALCIUM 8.5*  MG 2.1   Liver Function Tests: Recent Labs  Lab 10/27/19 2313  AST 91*  ALT 92*  ALKPHOS 108  BILITOT 1.2  PROT 7.9  ALBUMIN 4.1   No results for input(s): LIPASE, AMYLASE in the last 168 hours. No results for input(s): AMMONIA in the last 168 hours. CBC: Recent Labs  Lab 10/27/19 2313  WBC 8.9  NEUTROABS 4.1  HGB 13.8  HCT 43.5  MCV 98.2  PLT 198   Cardiac Enzymes: No results for input(s): CKTOTAL, CKMB, CKMBINDEX, TROPONINI in the last 168 hours. BNP: Invalid input(s): POCBNP CBG: Recent Labs  Lab 10/28/19 0748  GLUCAP 253*   D-Dimer No results for input(s): DDIMER in the  last 72 hours. Hgb A1c Recent Labs    10/28/19 0203  HGBA1C 10.3*   Lipid Profile No results for input(s): CHOL, HDL, LDLCALC, TRIG, CHOLHDL, LDLDIRECT in the last 72 hours. Thyroid function studies No results for input(s): TSH, T4TOTAL, T3FREE, THYROIDAB in the last 72 hours.  Invalid input(s): FREET3 Anemia work up No results for input(s): VITAMINB12, FOLATE, FERRITIN, TIBC, IRON, RETICCTPCT in the last 72 hours. Urinalysis    Component Value Date/Time   COLORURINE STRAW (A) 10/28/2019 0015   APPEARANCEUR CLEAR 10/28/2019 0015   APPEARANCEUR Clear 01/31/2012 1221   LABSPEC 1.010 10/28/2019 0015   LABSPEC 1.009 01/31/2012 1221   PHURINE 6.0 10/28/2019 0015   GLUCOSEU >=500 (A) 10/28/2019 0015   GLUCOSEU >=500 01/31/2012 1221   HGBUR NEGATIVE 10/28/2019 0015   BILIRUBINUR NEGATIVE 10/28/2019 0015   BILIRUBINUR Negative 01/31/2012 1221   KETONESUR NEGATIVE 10/28/2019 0015   PROTEINUR NEGATIVE 10/28/2019 0015   NITRITE NEGATIVE 10/28/2019 0015   LEUKOCYTESUR NEGATIVE 10/28/2019 0015   LEUKOCYTESUR Negative 01/31/2012 1221   Sepsis Labs Invalid input(s): PROCALCITONIN,  WBC,  LACTICIDVEN Microbiology Recent Results (from the past 240 hour(s))  Respiratory Panel by RT PCR (Flu A&B, Covid) - Nasopharyngeal Swab     Status: None   Collection  Time: 10/28/19 12:23 AM   Specimen: Nasopharyngeal Swab  Result Value Ref Range Status   SARS Coronavirus 2 by RT PCR NEGATIVE NEGATIVE Final    Comment: (NOTE) SARS-CoV-2 target nucleic acids are NOT DETECTED. The SARS-CoV-2 RNA is generally detectable in upper respiratoy specimens during the acute phase of infection. The lowest concentration of SARS-CoV-2 viral copies this assay can detect is 131 copies/mL. A negative result does not preclude SARS-Cov-2 infection and should not be used as the sole basis for treatment or other patient management decisions. A negative result may occur with  improper specimen collection/handling, submission of specimen other than nasopharyngeal swab, presence of viral mutation(s) within the areas targeted by this assay, and inadequate number of viral copies (<131 copies/mL). A negative result must be combined with clinical observations, patient history, and epidemiological information. The expected result is Negative. Fact Sheet for Patients:  PinkCheek.be Fact Sheet for Healthcare Providers:  GravelBags.it This test is not yet ap proved or cleared by the [place] FDA and  has been authorized for detection and/or diagnosis of SARS-CoV-2 by FDA under an Emergency Use Authorization (EUA). This EUA will remain  in effect (meaning this test can be used) for the duration of the COVID-19 declaration under Section 564(b)(1) of the Act, 21 U.S.C. section 360bbb-3(b)(1), unless the authorization is terminated or revoked sooner.    Influenza A by PCR NEGATIVE NEGATIVE Final   Influenza B by PCR NEGATIVE NEGATIVE Final    Comment: (NOTE) The Xpert Xpress SARS-CoV-2/FLU/RSV assay is intended as an aid in  the diagnosis of influenza from Nasopharyngeal swab specimens and  should not be used as a sole basis for treatment. Nasal washings and  aspirates are unacceptable for Xpert Xpress  SARS-CoV-2/FLU/RSV  testing. Fact Sheet for Patients: PinkCheek.be Fact Sheet for Healthcare Providers: GravelBags.it This test is not yet approved or cleared by the [place] FDA and  has been authorized for detection and/or diagnosis of SARS-CoV-2 by  FDA under an Emergency Use Authorization (EUA). This EUA will remain  in effect (meaning this test can be used) for the duration of the  Covid-19 declaration under Section 564(b)(1) of the Act, 21  U.S.C. section 360bbb-3(b)(1), unless the authorization is  terminated or revoked. Performed at [facility], [address], [place], [place] [address]      Time coordinating discharge: 35 minutes  SIGNED:   Rodena Goldmann, DO [facility] 10/28/2019, 9:41 AM  If 7PM-7AM, please contact night-coverage [email or web address]

## 2019-10-29 LAB — URINE CULTURE: Culture: <10000 — AB

## 2019-10-29 LAB — MRSA PCR SCREENING: MRSA by PCR: NEGATIVE

## 2020-04-16 ENCOUNTER — Other Ambulatory Visit: Payer: Self-pay | Admitting: Orthopedic Surgery

## 2020-04-16 DIAGNOSIS — M5431 Sciatica, right side: Secondary | ICD-10-CM

## 2020-05-13 ENCOUNTER — Ambulatory Visit
Admission: RE | Admit: 2020-05-13 | Discharge: 2020-05-13 | Disposition: A | Discharge status: Home / Self Care | Payer: Medicare Other | Source: Ambulatory Visit | Attending: Orthopedic Surgery | Admitting: Orthopedic Surgery

## 2020-05-13 ENCOUNTER — Other Ambulatory Visit: Payer: Self-pay

## 2020-05-13 DIAGNOSIS — M5431 Sciatica, right side: Secondary | ICD-10-CM | POA: Insufficient documentation

## 2020-09-06 ENCOUNTER — Other Ambulatory Visit: Payer: Self-pay | Admitting: Internal Medicine

## 2022-07-16 ENCOUNTER — Encounter: Payer: Self-pay | Admitting: Cardiology

## 2022-07-16 NOTE — Progress Notes (Deleted)
Cardiology Office Note  Date: 07/16/2022   ID: Felicia Chavez, DOB 1960-12-26, MRN [id number]  PCP:  Felicia Schwalbe, MD  Cardiologist:  None Electrophysiologist:  None   No chief complaint on file.   History of Present Illness: Felicia Chavez is a 61 y.o. female referred for cardiology consultation by Dr. Bartolo Darter with reported history of previous stroke based on limited records provided, no specific cardiac diagnosis.  I was able to review old chart information in Epic, she has had no regular follow-up for a few years, off medication since 2021 per Dr. Arliss Chavez documentation.  She recently reestablished primary care with him and was started back on medications.  I reviewed her recent lab work as noted below.  She was seen by neurology years back with history of stroke in 2013 and 2014, also carotid artery disease.  Carotid Dopplers as of 2017 showed moderate RICA stenosis and mild LICA stenosis as described below.  She also has a history of pulmonary nodule with last chest CT for surveillance being back in 2017, she was followed by Pulmonary at that time.  Past Medical History:  Diagnosis Date   Arthritis    Carotid artery disease ([place])    Essential hypertension    GERD (gastroesophageal reflux disease)    History of stroke    2013 and 2014   Pulmonary nodule    Type 2 diabetes mellitus ([place])     Past Surgical History:  Procedure Laterality Date   RADIOLOGY WITH ANESTHESIA N/A 10/18/2012   Procedure: RADIOLOGY WITH ANESTHESIA;  Surgeon: Felicia Hickman, MD;  Location: [facility];  Service: Radiology;  Laterality: N/A;   TUBAL LIGATION      Current Outpatient Medications  Medication Sig Dispense Refill   aspirin EC 81 MG tablet Take 1 tablet (81 mg total) by mouth daily. 30 tablet 2   clopidogrel (PLAVIX) 75 MG tablet Take 1 tablet (75 mg total) by mouth daily with breakfast. 30 tablet 2   dapagliflozin propanediol (FARXIGA) 10 MG TABS tablet Take 10 mg by mouth daily. 30 tablet  3   gabapentin (NEURONTIN) 100 MG capsule Take 1 capsule (100 mg total) by mouth 3 (three) times daily. 90 capsule 2   glipiZIDE (GLUCOTROL XL) 5 MG 24 hr tablet Take 1 tablet (5 mg total) by mouth daily. 30 tablet 2   glucose monitoring kit (FREESTYLE) monitoring kit 1 each by Does not apply route 4 (four) times daily - after meals and at bedtime. 1 month Diabetic Testing Supplies for QAC-QHS accuchecks. 1 each 1   hydrochlorothiazide (HYDRODIURIL) 25 MG tablet Take 1 tablet (25 mg total) by mouth daily. 30 tablet 3   pioglitazone (ACTOS) 15 MG tablet Take 1 tablet (15 mg total) by mouth daily. 30 tablet 11   pravastatin (PRAVACHOL) 40 MG tablet Take 1 tablet (40 mg total) by mouth daily. 30 tablet 2   sitaGLIPtin-metformin (JANUMET) 50-1000 MG tablet Take 1 tablet by mouth 2 (two) times daily with a meal. 60 tablet 3   No current facility-administered medications for this visit.   Allergies:  Lisinopril   Social History: The patient  reports that she quit smoking about 6 years ago. Her smoking use included cigarettes. She has a 2.00 pack-year smoking history. She has never used smokeless tobacco. She reports current alcohol use. She reports that she does not use drugs.   Family History: The patient's family history includes Hypertension in her mother.   ROS:  Please see the history  of present illness. Otherwise, complete review of systems is positive for {NONE DEFAULTED:18576}.  All other systems are reviewed and negative.   Physical Exam: VS:  There were no vitals taken for this visit., BMI There is no height or weight on file to calculate BMI.  Wt Readings from Last 3 Encounters:  10/28/19 175 lb 14.8 oz (79.8 kg)  06/19/16 183 lb (83 kg)  04/15/16 183 lb 6.4 oz (83.2 kg)    General: Patient appears comfortable at rest. HEENT: Conjunctiva and lids normal, oropharynx clear with moist mucosa. Neck: Supple, no elevated JVP or carotid bruits, no thyromegaly. Lungs: Clear to  auscultation, nonlabored breathing at rest. Cardiac: Regular rate and rhythm, no S3 or significant systolic murmur, no pericardial rub. Abdomen: Soft, nontender, no hepatomegaly, bowel sounds present, no guarding or rebound. Extremities: No pitting edema, distal pulses 2+. Skin: Warm and dry. Musculoskeletal: No kyphosis. Neuropsychiatric: Alert and oriented x3, affect grossly appropriate.  ECG:  An ECG dated 10/27/2019 was personally reviewed today and demonstrated:  Sinus tachycardia with nonspecific T wave changes.  Recent Labwork:  October 2023: BUN 13, creatinine 0.91, potassium 4.4, AST 17, ALT 15, hemoglobin 14.7, platelets 177, hemoglobin A1c 11.5%, cholesterol 271, triglycerides 191, HDL 76, LDL 160  Other Studies Reviewed Today:  Chest CT 06/16/2016: IMPRESSION: Stable indeterminate left upper and lower lobe pulmonary nodules, largest measuring 11 mm. Recommend continued followup by chest CT in 6 months. This recommendation follows the consensus statement: Guidelines for Management of Small Pulmonary Nodules Detected on CT Images: From the Fleischner Society 2017; Radiology 2017; 284:228-243.   Aortic atherosclerosis and hepatic steatosis incidentally noted.  Carotid Dopplers 08/04/2016: Summary:   - Technically difficult and limited due to anatomy of the neck,    high bifurcation, depth of the arteries, and tortuosity.  - Right - 60% to 79% bulb stenosis.  - Left - 1% to 39% ICA stenosis.  - Bilateral - Vertebral artery flow is antegrade.  - No significant change from previous exam 10/2015.   Echocardiogram 10/28/2019:  1. Left ventricular ejection fraction, by estimation, is 40 to 45%. The  left ventricle has mild to moderately decreased function. The left  ventricle demonstrates global hypokinesis. Left ventricular diastolic  parameters are indeterminate.   2. Right ventricular systolic function is mildly reduced. The right  ventricular size is normal. Tricuspid  regurgitation signal is inadequate  for assessing PA pressure.   3. The mitral valve is grossly normal. Mild mitral valve regurgitation.   4. The aortic valve is tricuspid. Aortic valve regurgitation is not  visualized.   5. The inferior vena cava is normal in size with greater than 50%  respiratory variability, suggesting right atrial pressure of 3 mmHg.   Assessment and Plan:   Medication Adjustments/Labs and Tests Ordered: Current medicines are reviewed at length with the patient today.  Concerns regarding medicines are outlined above.   Tests Ordered: No orders of the defined types were placed in this encounter.   Medication Changes: No orders of the defined types were placed in this encounter.   Disposition:  Follow up {follow up:15908}  Signed, Satira Sark, MD, [facility] 07/16/2022 4:39 PM    [facility] at [facility] [address] [place], [place] [address] Phone: [phone]; Fax: [phone]

## 2022-07-17 ENCOUNTER — Ambulatory Visit: Payer: Medicare Other | Admitting: Cardiology

## 2022-07-17 DIAGNOSIS — I6523 Occlusion and stenosis of bilateral carotid arteries: Secondary | ICD-10-CM

## 2022-09-16 ENCOUNTER — Ambulatory Visit: Payer: 59 | Attending: Cardiology | Admitting: Cardiology

## 2022-09-16 ENCOUNTER — Encounter: Payer: Self-pay | Admitting: Cardiology

## 2022-09-16 VITALS — BP 160/92 | HR 101 | Ht 62.0 in | Wt 176.4 lb

## 2022-09-16 DIAGNOSIS — I10 Essential (primary) hypertension: Secondary | ICD-10-CM | POA: Diagnosis not present

## 2022-09-16 DIAGNOSIS — Z8679 Personal history of other diseases of the circulatory system: Secondary | ICD-10-CM

## 2022-09-16 DIAGNOSIS — E782 Mixed hyperlipidemia: Secondary | ICD-10-CM

## 2022-09-16 DIAGNOSIS — Z8673 Personal history of transient ischemic attack (TIA), and cerebral infarction without residual deficits: Secondary | ICD-10-CM | POA: Diagnosis not present

## 2022-09-16 DIAGNOSIS — I6523 Occlusion and stenosis of bilateral carotid arteries: Secondary | ICD-10-CM

## 2022-09-16 MED ORDER — ATORVASTATIN CALCIUM 80 MG PO TABS: 80.0000 mg | ORAL_TABLET | Freq: Every day | ORAL | 3 refills | Status: AC

## 2022-09-16 MED ORDER — CARVEDILOL 6.25 MG PO TABS: 6.2500 mg | ORAL_TABLET | Freq: Two times a day (BID) | ORAL | 3 refills | Status: DC

## 2022-09-16 NOTE — Patient Instructions (Addendum)
Medication Instructions:   INCREASE Lipitor to 80 mg daily  START Coreg 6.25 mg Twice a day  Labwork: None today  Testing/Procedures: Your physician has requested that you have an echocardiogram. Echocardiography is a painless test that uses sound waves to create images of your heart. It provides your doctor with information about the size and shape of your heart and how well your heart's chambers and valves are working. This procedure takes approximately one hour. There are no restrictions for this procedure. Please do NOT wear cologne, perfume, aftershave, or lotions (deodorant is allowed). Please arrive 15 minutes prior to your appointment time.   Your physician has requested that you have a carotid duplex. This test is an ultrasound of the carotid arteries in your neck. It looks at blood flow through these arteries that supply the brain with blood. Allow one hour for this exam. There are no restrictions or special instructions.   Follow-Up:  To be determined after testing   Any Other Special Instructions Will Be Listed Below (If Applicable).  If you need a refill on your cardiac medications before your next appointment, please call your pharmacy.

## 2022-09-16 NOTE — Progress Notes (Signed)
Cardiology Office Note  Date: 09/16/2022   ID: Felicia Chavez, DOB [birth year], MRN [id number]  PCP:  Vidal Schwalbe, MD  Cardiologist:  Rozann Lesches, MD Electrophysiologist:  None   Chief Complaint  Patient presents with   Referred with history of PAD    History of Present Illness: Felicia Chavez is a 62 y.o. female referred for cardiology consultation by Dr. Bartolo Darter for evaluation of reported peripheral arterial disease.  I reviewed the available records and updated the chart.  Patient presents for evaluation not reporting any specific exertional chest pain, stable NYHA class II dyspnea, no palpitations or syncope.  She does have some degree of right arm weakness and loss of dexterity after prior stroke, otherwise able to ambulate stably.  She indicates walking for exercise when the weather allows.  No reported claudication.  I reviewed her records.  She underwent carotid Dopplers back in 2017 at which point she had 60 to 79% RICA stenosis and 1 to 01% LICA stenosis.  I do not see that any subsequent studies have been obtained for follow-up.  She also had an echocardiogram done in 2021 that indicated LVEF 40 to 45% with global hypokinesis.  Records indicate hospitalization at that time with acute hypoxic respiratory failure in the setting of uncontrolled hypertension with medication noncompliance.  I reviewed her medications today.  Also call placed to pharmacy to clarify regimen as she was not completely familiar with her medications by name.  She states that she has been taking them regularly.  I rechecked her blood pressure today at 160/92, heart rate 90-100 at rest with ECG showing sinus tachycardia and low voltage.  Lab work from October 2023 showed LDL 160.  Past Medical History:  Diagnosis Date   Arthritis    Carotid artery disease ([place])    Essential hypertension    GERD (gastroesophageal reflux disease)    History of stroke    2013 and 2014   Mixed hyperlipidemia     Obesity    PAD (peripheral artery disease) (H[facility])    Pulmonary nodule    Type 2 diabetes mellitus ([place])    Vitamin D deficiency disease     Past Surgical History:  Procedure Laterality Date   RADIOLOGY WITH ANESTHESIA N/A 10/18/2012   Procedure: RADIOLOGY WITH ANESTHESIA;  Surgeon: Rob Hickman, MD;  Location: [facility];  Service: Radiology;  Laterality: N/A;   TUBAL LIGATION      Current Outpatient Medications  Medication Sig Dispense Refill   amLODipine (NORVASC) 5 MG tablet Take 5 mg by mouth daily.     aspirin EC 81 MG tablet Take 1 tablet (81 mg total) by mouth daily. 30 tablet 2   atorvastatin (LIPITOR) 80 MG tablet Take 1 tablet (80 mg total) by mouth daily. 90 tablet 3   carvedilol (COREG) 6.25 MG tablet Take 1 tablet (6.25 mg total) by mouth 2 (two) times daily. 180 tablet 3   clopidogrel (PLAVIX) 75 MG tablet Take 1 tablet (75 mg total) by mouth daily with breakfast. 30 tablet 2   dapagliflozin propanediol (FARXIGA) 10 MG TABS tablet Take 10 mg by mouth daily. 30 tablet 3   gabapentin (NEURONTIN) 100 MG capsule Take 1 capsule (100 mg total) by mouth 3 (three) times daily. 90 capsule 2   glipiZIDE (GLUCOTROL XL) 5 MG 24 hr tablet Take 1 tablet (5 mg total) by mouth daily. 30 tablet 2   glucose monitoring kit (FREESTYLE) monitoring kit 1 each by Does not apply route  4 (four) times daily - after meals and at bedtime. 1 month Diabetic Testing Supplies for QAC-QHS accuchecks. 1 each 1   losartan (COZAAR) 50 MG tablet Take 50 mg by mouth 2 (two) times daily.     sitaGLIPtin-metformin (JANUMET) 50-1000 MG tablet Take 1 tablet by mouth 2 (two) times daily with a meal. 60 tablet 3   Vitamin D, Ergocalciferol, (DRISDOL) 1.25 MG (50000 UNIT) CAPS capsule Take 50,000 Units by mouth every 7 (seven) days.     No current facility-administered medications for this visit.   Allergies:  Lisinopril   Social History: The patient  reports that she quit smoking about 6 years ago. Her smoking  use included cigarettes. She has a 2.00 pack-year smoking history. She has never used smokeless tobacco. She reports current alcohol use. She reports that she does not use drugs.   Family History: The patient's family history includes Hypertension in her mother.   ROS: No syncope.  No orthopnea or PND.  No leg swelling.  Physical Exam: VS:  BP (!) 160/92 (BP Location: Left Arm)   Pulse (!) 101   Ht 5\' 2"  (1.575 m)   Wt 176 lb 6.4 oz (80 kg)   SpO2 95%   BMI 32.26 kg/m , BMI Body mass index is 32.26 kg/m.  Wt Readings from Last 3 Encounters:  09/16/22 176 lb 6.4 oz (80 kg)  10/28/19 175 lb 14.8 oz (79.8 kg)  06/19/16 183 lb (83 kg)    General: Patient appears comfortable at rest. HEENT: Conjunctiva and lids normal. Neck: Supple, no elevated JVP, right carotid bruit. Lungs: Clear to auscultation, nonlabored breathing at rest. Cardiac: Regular rate and rhythm, no S3, 1/6 systolic murmur, no pericardial rub. Abdomen: Soft, nontender, bowel sounds present. Extremities: No pitting edema, distal pulses 2+. Skin: Warm and dry. Musculoskeletal: No kyphosis. Neuropsychiatric: Alert and oriented x3, affect grossly appropriate.  ECG:  An ECG dated 10/27/2019 was personally reviewed today and demonstrated:  Sinus tachycardia with rightward axis, nonspecific ST-T changes.  Recent Labwork:  October 2023: Cholesterol 271, triglycerides 191, HDL 76, LDL 160, BUN 13, creatinine 0.91, potassium 4.4, AST 17, ALT 15, hemoglobin 14.7, platelets 177  Other Studies Reviewed Today:  Carotid Dopplers 08/04/2016: Summary:   - Technically difficult and limited due to anatomy of the neck,    high bifurcation, depth of the arteries, and tortuosity.  - Right - 60% to 79% bulb stenosis.  - Left - 1% to 39% ICA stenosis.  - Bilateral - Vertebral artery flow is antegrade.  - No significant change from previous exam 10/2015.   Echocardiogram 10/28/2019:  1. Left ventricular ejection fraction, by  estimation, is 40 to 45%. The  left ventricle has mild to moderately decreased function. The left  ventricle demonstrates global hypokinesis. Left ventricular diastolic  parameters are indeterminate.   2. Right ventricular systolic function is mildly reduced. The right  ventricular size is normal. Tricuspid regurgitation signal is inadequate  for assessing PA pressure.   3. The mitral valve is grossly normal. Mild mitral valve regurgitation.   4. The aortic valve is tricuspid. Aortic valve regurgitation is not  visualized.   5. The inferior vena cava is normal in size with greater than 50%  respiratory variability, suggesting right atrial pressure of 3 mmHg.   Assessment and Plan:  1.  Bilateral carotid artery disease based on Dopplers from 2017 indicating 60 to 79% RICA stenosis and 1 to 01% LICA stenosis.  She does have prior history of  stroke, also hypertension, type 2 diabetes mellitus, and hyperlipidemia.  Currently on aspirin, Plavix, and Lipitor by report.  Plan to advance Lipitor dose and check follow-up carotid Dopplers.  2.  Mixed hyperlipidemia, poorly controlled based on lab work from October 2023, LDL 160 at that time.  States that she is taking her medications, not certain about true compliance based on chart review.  Increase Lipitor to 80 mg daily.  3.  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  Follow-up echocardiogram will be obtained for repeat evaluation and help to guide subsequent modification of GDMT.  4.  Essential hypertension, blood pressure elevated today.  Call to pharmacy indicates that she is receiving Norvasc and Cozaar as listed.  Add Coreg 6.25 mg twice daily for now particularly in light of history of cardiomyopathy.  Medication Adjustments/Labs and Tests Ordered: Current medicines are reviewed at length with the patient  today.  Concerns regarding medicines are outlined above.   Tests Ordered: Orders Placed This Encounter  Procedures   US Carotid Duplex Bilateral   EKG 12-Lead   ECHOCARDIOGRAM COMPLETE    Medication Changes: Meds ordered this encounter  Medications   atorvastatin (LIPITOR) 80 MG tablet    Sig: Take 1 tablet (80 mg total) by mouth daily.    Dispense:  90 tablet    Refill:  3    09/16/22 increased to 80 mg qd   carvedilol (COREG) 6.25 MG tablet    Sig: Take 1 tablet (6.25 mg total) by mouth 2 (two) times daily.    Dispense:  180 tablet    Refill:  3    Disposition:  Follow up  test results.  Signed, Satira Sark, MD, [facility] 09/16/2022 1:41 PM    [place] [facility] [address] [place], [place] [address] Phone: [phone]; Fax: [phone]

## 2022-10-01 ENCOUNTER — Ambulatory Visit (HOSPITAL_COMMUNITY)
Admission: RE | Admit: 2022-10-01 | Discharge: 2022-10-01 | Disposition: A | Discharge status: Home / Self Care | Payer: 59 | Source: Ambulatory Visit | Attending: Cardiology | Admitting: Cardiology

## 2022-10-01 DIAGNOSIS — Z8679 Personal history of other diseases of the circulatory system: Secondary | ICD-10-CM | POA: Insufficient documentation

## 2022-10-01 DIAGNOSIS — I10 Essential (primary) hypertension: Secondary | ICD-10-CM

## 2022-10-01 DIAGNOSIS — I6523 Occlusion and stenosis of bilateral carotid arteries: Secondary | ICD-10-CM

## 2022-10-01 DIAGNOSIS — I509 Heart failure, unspecified: Secondary | ICD-10-CM

## 2022-10-01 LAB — ECHOCARDIOGRAM COMPLETE
Area-P 1/2: 3.6 cm2
Calc EF: 55.6 %
S' Lateral: 2.7 cm
Single Plane A2C EF: 59.3 %
Single Plane A4C EF: 55.8 %

## 2022-10-01 MED ORDER — PERFLUTREN LIPID MICROSPHERE
1.0000 mL | INTRAVENOUS | Status: DC | PRN
  Administered 2022-10-01: 4 mL via INTRAVENOUS

## 2022-10-01 NOTE — Progress Notes (Signed)
*  PRELIMINARY RESULTS* Echocardiogram 2D Echocardiogram has been performed with [facility].  Felicia Chavez 10/01/2022, 10:47 AM

## 2022-10-03 ENCOUNTER — Telehealth: Payer: Self-pay

## 2022-10-03 DIAGNOSIS — I6523 Occlusion and stenosis of bilateral carotid arteries: Secondary | ICD-10-CM

## 2022-10-03 NOTE — Telephone Encounter (Signed)
Results discussed with patient. I have placed referral to [facility],copied pcp

## 2022-10-03 NOTE — Telephone Encounter (Signed)
-----   Message from Satira Sark, MD sent at 10/01/2022 12:08 PM EST ----- Results reviewed.  Carotid Dopplers show progressive right ICA stenosis, now 70 to 99% range.  She is on aspirin, Plavix, and Lipitor.  Please get her set up for consultation with [facility].

## 2022-10-08 ENCOUNTER — Other Ambulatory Visit: Payer: Self-pay

## 2022-10-08 ENCOUNTER — Ambulatory Visit (INDEPENDENT_AMBULATORY_CARE_PROVIDER_SITE_OTHER): Payer: 59 | Admitting: Vascular Surgery

## 2022-10-08 ENCOUNTER — Ambulatory Visit (INDEPENDENT_AMBULATORY_CARE_PROVIDER_SITE_OTHER): Payer: 59

## 2022-10-08 ENCOUNTER — Encounter: Payer: Self-pay | Admitting: Vascular Surgery

## 2022-10-08 VITALS — BP 112/69 | HR 96 | Temp 97.3°F | Ht 62.0 in | Wt 177.6 lb

## 2022-10-08 DIAGNOSIS — I6521 Occlusion and stenosis of right carotid artery: Secondary | ICD-10-CM | POA: Diagnosis not present

## 2022-10-08 DIAGNOSIS — I6930 Unspecified sequelae of cerebral infarction: Secondary | ICD-10-CM | POA: Diagnosis not present

## 2022-10-08 NOTE — Progress Notes (Signed)
[facility] [place]  Patient name: Felicia Chavez MRN: [id number] DOB: 03-Feb-1961 Sex: female  REASON FOR CONSULT: Evaluation asymptomatic right internal carotid artery stenosis  HPI: Felicia Chavez is a 62 y.o. female, who is here today for evaluation.  She has a complex past history.  She did have a history of prior left brain stroke.  I have reviewed her records and imaging from that time.  She had a cerebral arteriogram in February 2014 showing a left ACA occlusion.  She was treated medically for her stroke.  She continues to have a right arm deficit.  She is right arm dominant.  Not have any leg deficit and no speech deficit.  She has not had any follow-up duplex for quite some time and recently was seen by Dr. Domenic Polite for cardiac evaluation.  He ordered a carotid duplex follow-up and this revealed severe right internal carotid artery stenosis.  No significant extracranial disease on the left.  She is seeing me today for further discussion.  She does not have any prior right brain events.  Past Medical History:  Diagnosis Date   Arthritis    Carotid artery disease ([place])    Essential hypertension    GERD (gastroesophageal reflux disease)    History of stroke    2013 and 2014   Mixed hyperlipidemia    Obesity    PAD (peripheral artery disease) (HCC)    Pulmonary nodule    Type 2 diabetes mellitus (HCC)    Vitamin D deficiency disease     Family History  Problem Relation Age of Onset   Hypertension Mother     SOCIAL HISTORY: Social History   Socioeconomic History   Marital status: Widowed    Spouse name: Not on file   Number of children: Not on file   Years of education: Not on file   Highest education level: Not on file  Occupational History   Not on file  Tobacco Use   Smoking status: Former    Packs/day: 0.50    Years: 4.00    Total pack years: 2.00    Types: Cigarettes    Quit date: 04/15/2016     Years since quitting: 6.4   Smokeless tobacco: Never   Tobacco comments:    quit since 03/2016  Vaping Use   Vaping Use: Never used  Substance and Sexual Activity   Alcohol use: Yes    Comment: 2-3 times a week   Drug use: No   Sexual activity: Not on file  Other Topics Concern   Not on file  Social History Narrative   Not on file   Social Determinants of Health   Financial Resource Strain: Not on file  Food Insecurity: Not on file  Transportation Needs: Not on file  Physical Activity: Not on file  Stress: Not on file  Social Connections: Not on file  Intimate Partner Violence: Not on file    Allergies  Allergen Reactions   Lisinopril Swelling    Facial swelling    Current Outpatient Medications  Medication Sig Dispense Refill   amLODipine (NORVASC) 5 MG tablet Take 5 mg by mouth daily.     aspirin EC 81 MG tablet Take 1 tablet (81 mg total) by mouth daily. 30 tablet 2   atorvastatin (LIPITOR) 80 MG tablet Take 1 tablet (80 mg total) by mouth daily. 90 tablet 3   carvedilol (COREG) 6.25 MG tablet Take 1 tablet (6.25 mg total) by mouth 2 (  two) times daily. 180 tablet 3   clopidogrel (PLAVIX) 75 MG tablet Take 1 tablet (75 mg total) by mouth daily with breakfast. 30 tablet 2   dapagliflozin propanediol (FARXIGA) 10 MG TABS tablet Take 10 mg by mouth daily. 30 tablet 3   gabapentin (NEURONTIN) 100 MG capsule Take 1 capsule (100 mg total) by mouth 3 (three) times daily. 90 capsule 2   glipiZIDE (GLUCOTROL XL) 5 MG 24 hr tablet Take 1 tablet (5 mg total) by mouth daily. 30 tablet 2   glucose monitoring kit (FREESTYLE) monitoring kit 1 each by Does not apply route 4 (four) times daily - after meals and at bedtime. 1 month Diabetic Testing Supplies for QAC-QHS accuchecks. 1 each 1   losartan (COZAAR) 50 MG tablet Take 50 mg by mouth 2 (two) times daily.     sitaGLIPtin-metformin (JANUMET) 50-1000 MG tablet Take 1 tablet by mouth 2 (two) times daily with a meal. 60 tablet 3    Vitamin D, Ergocalciferol, (DRISDOL) 1.25 MG (50000 UNIT) CAPS capsule Take 50,000 Units by mouth every 7 (seven) days.     No current facility-administered medications for this visit.    REVIEW OF SYSTEMS:  [X]$  denotes positive finding, [ ]$  denotes negative finding Cardiac  Comments:  Chest pain or chest pressure:    Shortness of breath upon exertion:    Short of breath when lying flat:    Irregular heart rhythm:        Vascular    Pain in calf, thigh, or hip brought on by ambulation:    Pain in feet at night that wakes you up from your sleep:     Blood clot in your veins:    Leg swelling:         Pulmonary    Oxygen at home:    Productive cough:     Wheezing:         Neurologic    Sudden weakness in arms or legs:  x   Sudden numbness in arms or legs:  x   Sudden onset of difficulty speaking or slurred speech:    Temporary loss of vision in one eye:     Problems with dizziness:         Gastrointestinal    Blood in stool:     Vomited blood:         Genitourinary    Burning when urinating:     Blood in urine:        Psychiatric    Major depression:         Hematologic    Bleeding problems:    Problems with blood clotting too easily:        Skin    Rashes or ulcers:        Constitutional    Fever or chills:      PHYSICAL EXAM: Vitals:   10/08/22 0938 10/08/22 0941  BP: 127/74 112/69  Pulse: 96   Temp: (!) 97.3 F (36.3 C)   SpO2: 97%   Weight: 177 lb 9.6 oz (80.6 kg)   Height: 5' 2"$  (1.575 m)     GENERAL: The patient is a well-nourished female, in no acute distress. The vital signs are documented above. CARDIOVASCULAR: Carotid arteries without bruits bilaterally.  2+ radial pulses bilaterally. PULMONARY: There is good air exchange  MUSCULOSKELETAL: There are no major deformities or cyanosis. NEUROLOGIC: No focal weakness or paresthesias are detected. SKIN: There are no ulcers or rashes noted. PSYCHIATRIC: The patient  has a normal affect.  DATA:   Duplex from [facility] showing severe right carotid stenosis.  We repeated her carotid duplex today in our office to determine if she was a candidate for endarterectomy based on duplex alone.  This did show severe stenosis with end-diastolic velocities of [id number] cm/s.  She did have a moderately high bifurcation.  Her internal carotid appeared to be normal past the bifurcation with some tortuosity.  The patient did have a follow-up catheter-based cerebral arteriogram in April 2016.  I have reviewed these images.  This did show a moderate right carotid stenosis with slightly high bifurcation at that time.  She had completely normal common and internal carotid arteries above this.  MEDICAL ISSUES: Severe asymptomatic right internal carotid artery stenosis.  I discussed the significance of with the patient.  I did explain that this would put her at risk for approximately 5 %/year risk of neurologic deficit based on this high-grade asymptomatic stenosis.  This would affect her left side.  Have recommended endarterectomy.  I explained the procedure in detail including overnight hospitalization.  Also discussed the 1-1 and half percent risk of stroke with surgery.  Will review the case with Dr. Donzetta Matters and plan surgery with him.  I will also ensure that she does not need any further cardiac workup with Dr. Larene Beach, MD [facility] [phone] Pager [phone]  Note: Portions of this report may have been transcribed using voice recognition software.  Every effort has been made to ensure accuracy; however, inadvertent computerized transcription errors may still be present.

## 2022-10-08 NOTE — H&P (View-Only) (Signed)
  [facility] [place]  Patient name: Felicia Chavez MRN: [id number] DOB: 04/22/1961 Sex: female  REASON FOR CONSULT: Evaluation asymptomatic right internal carotid artery stenosis  HPI: Felicia Chavez is a 62 y.o. female, who is here today for evaluation.  She has a complex past history.  She did have a history of prior left brain stroke.  I have reviewed her records and imaging from that time.  She had a cerebral arteriogram in February 2014 showing a left ACA occlusion.  She was treated medically for her stroke.  She continues to have a right arm deficit.  She is right arm dominant.  Not have any leg deficit and no speech deficit.  She has not had any follow-up duplex for quite some time and recently was seen by Dr. McDowell for cardiac evaluation.  He ordered a carotid duplex follow-up and this revealed severe right internal carotid artery stenosis.  No significant extracranial disease on the left.  She is seeing me today for further discussion.  She does not have any prior right brain events.  Past Medical History:  Diagnosis Date   Arthritis    Carotid artery disease (HCC)    Essential hypertension    GERD (gastroesophageal reflux disease)    History of stroke    2013 and 2014   Mixed hyperlipidemia    Obesity    PAD (peripheral artery disease) (HCC)    Pulmonary nodule    Type 2 diabetes mellitus (HCC)    Vitamin D deficiency disease     Family History  Problem Relation Age of Onset   Hypertension Mother     SOCIAL HISTORY: Social History   Socioeconomic History   Marital status: Widowed    Spouse name: Not on file   Number of children: Not on file   Years of education: Not on file   Highest education level: Not on file  Occupational History   Not on file  Tobacco Use   Smoking status: Former    Packs/day: 0.50    Years: 4.00    Total pack years: 2.00    Types: Cigarettes    Quit date: 04/15/2016     Years since quitting: 6.4   Smokeless tobacco: Never   Tobacco comments:    quit since 03/2016  Vaping Use   Vaping Use: Never used  Substance and Sexual Activity   Alcohol use: Yes    Comment: 2-3 times a week   Drug use: No   Sexual activity: Not on file  Other Topics Concern   Not on file  Social History Narrative   Not on file   Social Determinants of Health   Financial Resource Strain: Not on file  Food Insecurity: Not on file  Transportation Needs: Not on file  Physical Activity: Not on file  Stress: Not on file  Social Connections: Not on file  Intimate Partner Violence: Not on file    Allergies  Allergen Reactions   Lisinopril Swelling    Facial swelling    Current Outpatient Medications  Medication Sig Dispense Refill   amLODipine (NORVASC) 5 MG tablet Take 5 mg by mouth daily.     aspirin EC 81 MG tablet Take 1 tablet (81 mg total) by mouth daily. 30 tablet 2   atorvastatin (LIPITOR) 80 MG tablet Take 1 tablet (80 mg total) by mouth daily. 90 tablet 3   carvedilol (COREG) 6.25 MG tablet Take 1 tablet (6.25 mg total) by mouth 2 (  two) times daily. 180 tablet 3   clopidogrel (PLAVIX) 75 MG tablet Take 1 tablet (75 mg total) by mouth daily with breakfast. 30 tablet 2   dapagliflozin propanediol (FARXIGA) 10 MG TABS tablet Take 10 mg by mouth daily. 30 tablet 3   gabapentin (NEURONTIN) 100 MG capsule Take 1 capsule (100 mg total) by mouth 3 (three) times daily. 90 capsule 2   glipiZIDE (GLUCOTROL XL) 5 MG 24 hr tablet Take 1 tablet (5 mg total) by mouth daily. 30 tablet 2   glucose monitoring kit (FREESTYLE) monitoring kit 1 each by Does not apply route 4 (four) times daily - after meals and at bedtime. 1 month Diabetic Testing Supplies for QAC-QHS accuchecks. 1 each 1   losartan (COZAAR) 50 MG tablet Take 50 mg by mouth 2 (two) times daily.     sitaGLIPtin-metformin (JANUMET) 50-1000 MG tablet Take 1 tablet by mouth 2 (two) times daily with a meal. 60 tablet 3    Vitamin D, Ergocalciferol, (DRISDOL) 1.25 MG (50000 UNIT) CAPS capsule Take 50,000 Units by mouth every 7 (seven) days.     No current facility-administered medications for this visit.    REVIEW OF SYSTEMS:  [X] denotes positive finding, [ ] denotes negative finding Cardiac  Comments:  Chest pain or chest pressure:    Shortness of breath upon exertion:    Short of breath when lying flat:    Irregular heart rhythm:        Vascular    Pain in calf, thigh, or hip brought on by ambulation:    Pain in feet at night that wakes you up from your sleep:     Blood clot in your veins:    Leg swelling:         Pulmonary    Oxygen at home:    Productive cough:     Wheezing:         Neurologic    Sudden weakness in arms or legs:  x   Sudden numbness in arms or legs:  x   Sudden onset of difficulty speaking or slurred speech:    Temporary loss of vision in one eye:     Problems with dizziness:         Gastrointestinal    Blood in stool:     Vomited blood:         Genitourinary    Burning when urinating:     Blood in urine:        Psychiatric    Major depression:         Hematologic    Bleeding problems:    Problems with blood clotting too easily:        Skin    Rashes or ulcers:        Constitutional    Fever or chills:      PHYSICAL EXAM: Vitals:   10/08/22 0938 10/08/22 0941  BP: 127/74 112/69  Pulse: 96   Temp: (!) 97.3 F (36.3 C)   SpO2: 97%   Weight: 177 lb 9.6 oz (80.6 kg)   Height: 5' 2" (1.575 m)     GENERAL: The patient is a well-nourished female, in no acute distress. The vital signs are documented above. CARDIOVASCULAR: Carotid arteries without bruits bilaterally.  2+ radial pulses bilaterally. PULMONARY: There is good air exchange  MUSCULOSKELETAL: There are no major deformities or cyanosis. NEUROLOGIC: No focal weakness or paresthesias are detected. SKIN: There are no ulcers or rashes noted. PSYCHIATRIC: The patient   has a normal affect.  DATA:   Duplex from [facility] showing severe right carotid stenosis.  We repeated her carotid duplex today in our office to determine if she was a candidate for endarterectomy based on duplex alone.  This did show severe stenosis with end-diastolic velocities of 130 cm/s.  She did have a moderately high bifurcation.  Her internal carotid appeared to be normal past the bifurcation with some tortuosity.  The patient did have a follow-up catheter-based cerebral arteriogram in April 2016.  I have reviewed these images.  This did show a moderate right carotid stenosis with slightly high bifurcation at that time.  She had completely normal common and internal carotid arteries above this.  MEDICAL ISSUES: Severe asymptomatic right internal carotid artery stenosis.  I discussed the significance of with the patient.  I did explain that this would put her at risk for approximately 5 %/year risk of neurologic deficit based on this high-grade asymptomatic stenosis.  This would affect her left side.  Have recommended endarterectomy.  I explained the procedure in detail including overnight hospitalization.  Also discussed the 1-1 and half percent risk of stroke with surgery.  Will review the case with Dr. Cain and plan surgery with him.  I will also ensure that she does not need any further cardiac workup with Dr. McDowell   Cordarius Benning F. Llesenia Fogal, MD FACS [facility] [place] Office Tel [phone] Pager [phone]  Note: Portions of this report may have been transcribed using voice recognition software.  Every effort has been made to ensure accuracy; however, inadvertent computerized transcription errors may still be present.  

## 2022-10-09 ENCOUNTER — Other Ambulatory Visit: Payer: Self-pay

## 2022-10-09 ENCOUNTER — Other Ambulatory Visit (HOSPITAL_COMMUNITY): Payer: Self-pay | Admitting: Emergency Medicine

## 2022-10-09 DIAGNOSIS — I6521 Occlusion and stenosis of right carotid artery: Secondary | ICD-10-CM

## 2022-10-09 DIAGNOSIS — Z1231 Encounter for screening mammogram for malignant neoplasm of breast: Secondary | ICD-10-CM

## 2022-10-15 ENCOUNTER — Ambulatory Visit (HOSPITAL_COMMUNITY)
Admission: RE | Admit: 2022-10-15 | Discharge: 2022-10-15 | Disposition: A | Discharge status: Home / Self Care | Payer: 59 | Source: Ambulatory Visit | Attending: Emergency Medicine | Admitting: Emergency Medicine

## 2022-10-15 DIAGNOSIS — Z1231 Encounter for screening mammogram for malignant neoplasm of breast: Secondary | ICD-10-CM | POA: Insufficient documentation

## 2022-10-20 ENCOUNTER — Encounter: Payer: Self-pay | Admitting: Nurse Practitioner

## 2022-10-20 ENCOUNTER — Ambulatory Visit: Payer: 59 | Attending: Nurse Practitioner | Admitting: Nurse Practitioner

## 2022-10-20 VITALS — BP 116/68 | HR 91 | Ht 64.0 in | Wt 177.6 lb

## 2022-10-20 DIAGNOSIS — I5022 Chronic systolic (congestive) heart failure: Secondary | ICD-10-CM

## 2022-10-20 DIAGNOSIS — I6521 Occlusion and stenosis of right carotid artery: Secondary | ICD-10-CM | POA: Diagnosis not present

## 2022-10-20 DIAGNOSIS — I5020 Unspecified systolic (congestive) heart failure: Secondary | ICD-10-CM

## 2022-10-20 DIAGNOSIS — E782 Mixed hyperlipidemia: Secondary | ICD-10-CM

## 2022-10-20 DIAGNOSIS — Z01810 Encounter for preprocedural cardiovascular examination: Secondary | ICD-10-CM | POA: Diagnosis not present

## 2022-10-20 DIAGNOSIS — I10 Essential (primary) hypertension: Secondary | ICD-10-CM

## 2022-10-20 NOTE — Patient Instructions (Signed)
Medication Instructions:  Your physician recommends that you continue on your current medications as directed. Please refer to the Current Medication list given to you today.   Labwork: none  Testing/Procedures: none  Follow-Up:  Your physician recommends that you schedule a follow-up appointment in: Keep scheduled appointment in May with APP.  Any Other Special Instructions Will Be Listed Below (If Applicable).  If you need a refill on your cardiac medications before your next appointment, please call your pharmacy.

## 2022-10-20 NOTE — Progress Notes (Addendum)
Office Visit    Patient Name: Felicia Chavez Date of Encounter: 10/20/2022  PCP:  Vidal Schwalbe, [facility]  Cardiologist:  Rozann Lesches, MD  Advanced Practice Provider:  Finis Bud, NP Electrophysiologist:  None  [id number]} Chief Complaint    Felicia Chavez is a 62 y.o. female with a hx of carotid artery disease, mixed hyperlipidemia, HFmrEF, prior stroke, T2DM, and hypertension who presents today for preoperative cardiovascular risk assessment for pending right carotid endarterectomy by Dr. Donzetta Matters on October 28, 2022.  Past Medical History    Past Medical History:  Diagnosis Date   Arthritis    Carotid artery disease ([place])    Essential hypertension    GERD (gastroesophageal reflux disease)    History of stroke    2013 and 2014   Mixed hyperlipidemia    Obesity    PAD (peripheral artery disease) ([facility])    Pulmonary nodule    Type 2 diabetes mellitus ([place])    Vitamin D deficiency disease    Past Surgical History:  Procedure Laterality Date   RADIOLOGY WITH ANESTHESIA N/A 10/18/2012   Procedure: RADIOLOGY WITH ANESTHESIA;  Surgeon: Rob Hickman, MD;  Location: [facility];  Service: Radiology;  Laterality: N/A;   TUBAL LIGATION      Allergies  Allergies  Allergen Reactions   Lisinopril Swelling    Facial swelling    History of Present Illness    Felicia Chavez is a 62 y.o. female with a PMH as mentioned above.   Last seen by Dr. Domenic Polite on September 16, 2022.  Updated TTE revealed EF 50 to 55%, no RWMA, he grade 1 DD,  no significant valvular abnormalities.   Carotid Dopplers revealed 70 to 99% stenosis at right carotid bifurcation, moderate heterogeneous plaque with no hemodynamically significant stenosis along left carotid artery.  Repeat carotid duplex performed with Dr. Donnetta Hutching noted 8 to 99% stenosis along right ICA, no evidence of stenosis along left ICA.  Carvedilol added to medication regimen, Lipitor increased.  Today  she presents for follow-up. Doing well. Denies any chest pain, shortness of breath, palpitations, syncope, presyncope, dizziness, orthopnea, PND, swelling or significant weight changes, acute bleeding, or claudication. Tolerating medications well.   EKGs/Labs/Other Studies Reviewed:   The following studies were reviewed today:   EKG:  EKG is not ordered today. EKG dated 09/16/2022 reviewed and revealed ST, 101 bpm, otherwise no acute ischemic changes.   Bilateral carotid duplex on February 21st, 2024: Summary:  Right Carotid: Velocities in the right ICA are consistent with a 80-99% stenosis.   Left Carotid: There is no evidence of stenosis in the left ICA.   Vertebrals:  Bilateral vertebral arteries demonstrate antegrade flow.  Subclavians: Normal flow hemodynamics were seen in bilateral subclavian arteries.  Carotid Doppler on 10/01/2022: IMPRESSION: Right: Heterogeneous plaque at the right carotid bifurcation contributes to 70%-99% stenosis by established duplex criteria.   Left: Color duplex indicates moderate heterogeneous plaque with no hemodynamically significant stenosis by duplex criteria in the extracranial cerebrovascular circulation.  Echocardiogram on 10/01/2022: 1. Left ventricular ejection fraction, by estimation, is 50 to 55%. The  left ventricle has low normal function. The left ventricle has no regional  wall motion abnormalities. Left ventricular diastolic parameters are  consistent with Grade I diastolic  dysfunction (impaired relaxation).   2. Right ventricular systolic function is normal. The right ventricular  size is normal. Tricuspid regurgitation signal is inadequate for assessing  PA pressure.  3. The mitral valve is mildly degenerative. Trivial mitral valve  regurgitation.   4. The aortic valve is tricuspid. Aortic valve regurgitation is not  visualized.   5. The inferior vena cava is normal in size with greater than 50%  respiratory variability,  suggesting right atrial pressure of 3 mmHg.   Comparison(s): Prior images reviewed side by side. LVEF has improved in  comparison.  Recent Labs: No results found for requested labs within last 365 days.  Recent Lipid Panel    Component Value Date/Time   CHOL 182 09/21/2012 0650   TRIG 206 (H) 09/21/2012 0650   HDL 45 09/21/2012 0650   CHOLHDL 4.0 09/21/2012 0650   VLDL 41 (H) 09/21/2012 0650   LDLCALC 96 09/21/2012 0650    Home Medications   Current Meds  Medication Sig   amLODipine (NORVASC) 5 MG tablet Take 5 mg by mouth daily.   aspirin EC 81 MG tablet Take 1 tablet (81 mg total) by mouth daily.   atorvastatin (LIPITOR) 80 MG tablet Take 1 tablet (80 mg total) by mouth daily.   carvedilol (COREG) 6.25 MG tablet Take 1 tablet (6.25 mg total) by mouth 2 (two) times daily.   clopidogrel (PLAVIX) 75 MG tablet Take 1 tablet (75 mg total) by mouth daily with breakfast.   dapagliflozin propanediol (FARXIGA) 10 MG TABS tablet Take 10 mg by mouth daily.   gabapentin (NEURONTIN) 100 MG capsule Take 1 capsule (100 mg total) by mouth 3 (three) times daily.   glipiZIDE (GLUCOTROL XL) 5 MG 24 hr tablet Take 1 tablet (5 mg total) by mouth daily.   glucose monitoring kit (FREESTYLE) monitoring kit 1 each by Does not apply route 4 (four) times daily - after meals and at bedtime. 1 month Diabetic Testing Supplies for QAC-QHS accuchecks.   losartan (COZAAR) 50 MG tablet Take 50 mg by mouth 2 (two) times daily.   sitaGLIPtin-metformin (JANUMET) 50-1000 MG tablet Take 1 tablet by mouth 2 (two) times daily with a meal.   Vitamin D, Ergocalciferol, (DRISDOL) 1.25 MG (50000 UNIT) CAPS capsule Take 50,000 Units by mouth every 7 (seven) days.     Review of Systems    All other systems reviewed and are otherwise negative except as noted above.  Physical Exam    VS:  BP 116/68   Pulse 91   Ht '5\' 4"'$  (1.626 m)   Wt 177 lb 9.6 oz (80.6 kg)   SpO2 94%   BMI 30.48 kg/m  , BMI Body mass index is  30.48 kg/m.  Wt Readings from Last 3 Encounters:  10/20/22 177 lb 9.6 oz (80.6 kg)  10/08/22 177 lb 9.6 oz (80.6 kg)  09/16/22 176 lb 6.4 oz (80 kg)     GEN: Well nourished, well developed, in no acute distress. HEENT: normal. Neck: Supple, no JVD, carotid bruits, or masses. Cardiac: S1/S2, RRR, no murmurs, rubs, or gallops. No clubbing, cyanosis, edema.  Radials/PT 2+ and equal bilaterally.  Respiratory:  Respirations regular and unlabored, clear to auscultation bilaterally. MS: No deformity or atrophy. Skin: Warm and dry, no rash. Neuro:  Strength and sensation are intact. Psych: Normal affect.  Assessment & Plan    Pre-operative Cardiovascular Risk Assessment Ms. Gullickson's perioperative risk of a major cardiac event is 0.9% according to the Revised Cardiac Risk Index (RCRI).  Therefore, she is at low risk for perioperative complications.   Her functional capacity is excellent at 8.33 METs according to the Duke Activity Status Index (DASI). Recommendations: According  to ACC/AHA guidelines, no further cardiovascular testing needed.  The patient may proceed to surgery at acceptable risk.   Antiplatelet and/or Anticoagulation Recommendations: Okay to hold aspirin 7 days prior to procedure and okay to resume as soon as possible postop per VVS's recommendations.  Okay to hold Plavix 5 days prior to procedure and may resume as soon as possible postop per VV's recommendations.  Will route note to requesting party.   2. HFimpEF TTE 09/2022 revealed improved EF to 50 to 55%. Euvolemic and well compensated on exam.  Continue carvedilol, Farxiga, and losartan. Low sodium diet, fluid restriction <2L, and daily weights encouraged. Educated to contact our office for weight gain of 2 lbs overnight or 5 lbs in one week. Heart healthy diet and regular cardiovascular exercise encouraged.   3. Right carotid artery stenosis Most recent bilateral carotid duplex ordered by Dr. Sherren Mocha early revealed 53 to  99% stenosis along right ICA, no evidence of stenosis along the left ICA.  Asymptomatic at this time.  Continue aspirin, atorvastatin, and Plavix. Heart healthy diet and regular cardiovascular exercise encouraged.   4. Mixed HLD No recent lipid panel on file.  At last office visit, Lipitor was increased by Dr. Domenic Polite.  Recommend repeating FLP and LFT at next office visit. Heart healthy diet and regular cardiovascular exercise encouraged.   5. HTN Blood pressure stable.  No medication changes at this time. Discussed to monitor BP at home at least 2 hours after medications and sitting for 5-10 minutes. Heart healthy diet and regular cardiovascular exercise encouraged.    Disposition: Follow up in 3 month(s) with Rozann Lesches, MD or APP.  Signed, Finis Bud, NP 10/20/2022, 2:05 PM [place]

## 2022-10-23 NOTE — Pre-Procedure Instructions (Signed)
Surgical Instructions    Your procedure is scheduled on Tuesday, October 28, 2022 at 10:09 am.  Report to [facility] Main Entrance "A" at 8:00 A.M., then check in with the Admitting office.  Call this number if you have problems the morning of surgery:  [phone]   If you have any questions prior to your surgery date call [phone]: Open Monday-Friday 8am-4pm If you experience any cold or flu symptoms such as cough, fever, chills, shortness of breath, etc. between now and your scheduled surgery, please notify us at the above number     Remember:  Do not eat or drink after midnight the night before your surgery    Take these medicines the morning of surgery with A SIP OF WATER:   amLODipine (NORVASC)   carvedilol (COREG)   atorvastatin (LIPITOR)   gabapentin (NEURONTIN)   acetaminophen (TYLENOL)-if needed   HOLD ASPIRIN 7 DAYS PRIOR TO PROCEDURE (last dose on 10/20/22)  HOLD PLAVIX 5 DAYS PRIOR TO PROCEDURE (last dose on 10/22/22)  Horace 3 DAYS PRIOR TO PROCEDURE (last dose on 10/23/32)  As of today, STOP taking Aleve, Naproxen, Ibuprofen, Motrin, Advil, Goody's, BC's, all herbal medications, fish oil, and all vitamins.  WHAT DO I DO ABOUT MY DIABETES MEDICATION?   Do not take oral diabetes medicines glipiZIDE (GLUCOTROL XL) , sitaGLIPtin-metformin (JANUMET) (pills) the morning of surgery.   HOW TO MANAGE YOUR DIABETES BEFORE AND AFTER SURGERY  Why is it important to control my blood sugar before and after surgery? Improving blood sugar levels before and after surgery helps healing and can limit problems. A way of improving blood sugar control is eating a healthy diet by:  Eating less sugar and carbohydrates  Increasing activity/exercise  Talking with your doctor about reaching your blood sugar goals High blood sugars (greater than 180 mg/dL) can raise your risk of infections and slow your recovery, so you will need to focus on controlling your diabetes during the  weeks before surgery. Make sure that the doctor who takes care of your diabetes knows about your planned surgery including the date and location.  How do I manage my blood sugar before surgery? Check your blood sugar at least 4 times a day, starting 2 days before surgery, to make sure that the level is not too high or low.  Check your blood sugar the morning of your surgery when you wake up and every 2 hours until you get to the Short Stay unit.  If your blood sugar is less than 70 mg/dL, you will need to treat for low blood sugar: Do not take insulin. Treat a low blood sugar (less than 70 mg/dL) with  cup of clear juice (cranberry or apple), 4 glucose tablets, OR glucose gel. Recheck blood sugar in 15 minutes after treatment (to make sure it is greater than 70 mg/dL). If your blood sugar is not greater than 70 mg/dL on recheck, call [phone] for further instructions. Report your blood sugar to the short stay nurse when you get to Short Stay.  If you are admitted to the hospital after surgery: Your blood sugar will be checked by the staff and you will probably be given insulin after surgery (instead of oral diabetes medicines) to make sure you have good blood sugar levels. The goal for blood sugar control after surgery is 80-180 mg/dL.           Do not wear jewelry or makeup. Do not wear lotions, powders, perfumes or deodorant. Do not  shave 48 hours prior to surgery.   Do not bring valuables to the hospital. Do not wear nail polish, gel polish, artificial nails, or any other type of covering on natural nails (fingers and toes) If you have artificial nails or gel coating that need to be removed by a nail salon, please have this removed prior to surgery. Artificial nails or gel coating may interfere with anesthesia's ability to adequately monitor your vital signs.  [facility] is not responsible for any belongings or valuables.    Do NOT Smoke (Tobacco/Vaping)  24 hours prior to your  procedure  If you use a CPAP at night, you may bring your mask for your overnight stay.   Contacts, glasses, hearing aids, dentures or partials may not be worn into surgery, please bring cases for these belongings   For patients admitted to the hospital, discharge time will be determined by your treatment team.   Patients discharged the day of surgery will not be allowed to drive home, and someone needs to stay with them for 24 hours.   SURGICAL WAITING ROOM VISITATION Patients having surgery or a procedure may have no more than 2 support people in the waiting area - these visitors may rotate.   Children under the age of 28 must have an adult with them who is not the patient. If the patient needs to stay at the hospital during part of their recovery, the visitor guidelines for inpatient rooms apply. Pre-op nurse will coordinate an appropriate time for 1 support person to accompany patient in pre-op.  This support person may not rotate.   Please refer to [email or web address] for the visitor guidelines for Inpatients (after your surgery is over and you are in a regular room).    Special instructions:    Oral Hygiene is also important to reduce your risk of infection.  Remember - BRUSH YOUR TEETH THE MORNING OF SURGERY WITH YOUR REGULAR TOOTHPASTE   - Preparing For Surgery  Before surgery, you can play an important role. Because skin is not sterile, your skin needs to be as free of germs as possible. You can reduce the number of germs on your skin by washing with CHG (chlorahexidine gluconate) Soap before surgery.  CHG is an antiseptic cleaner which kills germs and bonds with the skin to continue killing germs even after washing.     Please do not use if you have an allergy to CHG or antibacterial soaps. If your skin becomes reddened/irritated stop using the CHG.  Do not shave (including legs and underarms) for at least 48 hours  prior to first CHG shower. It is OK to shave your face.  Please follow these instructions carefully.     Shower the NIGHT BEFORE SURGERY and the MORNING OF SURGERY with CHG Soap.   If you chose to wash your hair, wash your hair first as usual with your normal shampoo. After you shampoo, rinse your hair and body thoroughly to remove the shampoo.  Then [employer] and genitals (private parts) with your normal soap and rinse thoroughly to remove soap.  After that Use CHG Soap as you would any other liquid soap. You can apply CHG directly to the skin and wash gently with a scrungie or a clean washcloth.   Apply the CHG Soap to your body ONLY FROM THE NECK DOWN.  Do not use on open wounds or open sores. Avoid contact with your eyes, ears, mouth and genitals (private parts). Wash Face and genitals (private  parts)  with your normal soap.   Wash thoroughly, paying special attention to the area where your surgery will be performed.  Thoroughly rinse your body with warm water from the neck down.  DO NOT shower/wash with your normal soap after using and rinsing off the CHG Soap.  Pat yourself dry with a CLEAN TOWEL.  Wear CLEAN PAJAMAS to bed the night before surgery  Place CLEAN SHEETS on your bed the night before your surgery  DO NOT SLEEP WITH PETS.   Day of Surgery:  Take a shower with CHG soap. Wear Clean/Comfortable clothing the morning of surgery Do not apply any deodorants/lotions.   Remember to brush your teeth WITH YOUR REGULAR TOOTHPASTE.    If you received a COVID test during your pre-op visit, it is requested that you wear a mask when out in public, stay away from anyone that may not be feeling well, and notify your surgeon if you develop symptoms. If you have been in contact with anyone that has tested positive in the last 10 days, please notify your surgeon.    Please read over the following fact sheets that you were given.

## 2022-10-24 ENCOUNTER — Other Ambulatory Visit: Payer: Self-pay

## 2022-10-24 ENCOUNTER — Encounter (HOSPITAL_COMMUNITY)
Admission: RE | Admit: 2022-10-24 | Discharge: 2022-10-24 | Disposition: A | Discharge status: Home / Self Care | Payer: 59 | Source: Ambulatory Visit | Attending: Vascular Surgery | Admitting: Vascular Surgery

## 2022-10-24 ENCOUNTER — Encounter (HOSPITAL_COMMUNITY): Payer: Self-pay

## 2022-10-24 DIAGNOSIS — E1151 Type 2 diabetes mellitus with diabetic peripheral angiopathy without gangrene: Secondary | ICD-10-CM | POA: Diagnosis not present

## 2022-10-24 DIAGNOSIS — I6521 Occlusion and stenosis of right carotid artery: Secondary | ICD-10-CM | POA: Diagnosis not present

## 2022-10-24 DIAGNOSIS — I10 Essential (primary) hypertension: Secondary | ICD-10-CM | POA: Diagnosis not present

## 2022-10-24 DIAGNOSIS — Z01812 Encounter for preprocedural laboratory examination: Secondary | ICD-10-CM | POA: Diagnosis present

## 2022-10-24 DIAGNOSIS — Z01818 Encounter for other preprocedural examination: Secondary | ICD-10-CM

## 2022-10-24 DIAGNOSIS — E119 Type 2 diabetes mellitus without complications: Secondary | ICD-10-CM

## 2022-10-24 LAB — COMPREHENSIVE METABOLIC PANEL
ALT: 13 U/L (ref 0–44)
AST: 19 U/L (ref 15–41)
Albumin: 4.3 g/dL (ref 3.5–5.0)
Alkaline Phosphatase: 80 U/L (ref 38–126)
Anion gap: 9 (ref 5–15)
BUN: 19 mg/dL (ref 8–23)
CO2: 27 mmol/L (ref 22–32)
Calcium: 10 mg/dL (ref 8.9–10.3)
Chloride: 100 mmol/L (ref 98–111)
Creatinine, Ser: 1.1 mg/dL — ABNORMAL HIGH (ref 0.44–1.00)
GFR, Estimated: 57 mL/min — ABNORMAL LOW (ref >60)
Glucose, Bld: 109 mg/dL — ABNORMAL HIGH (ref 70–99)
Potassium: 3.9 mmol/L (ref 3.5–5.1)
Sodium: 136 mmol/L (ref 135–145)
Total Bilirubin: 0.8 mg/dL (ref 0.3–1.2)
Total Protein: 7.9 g/dL (ref 6.5–8.1)

## 2022-10-24 LAB — CBC
HCT: 42.8 % (ref 36.0–46.0)
Hemoglobin: 14.9 g/dL (ref 12.0–15.0)
MCH: 31.8 pg (ref 26.0–34.0)
MCHC: 34.8 g/dL (ref 30.0–36.0)
MCV: 91.3 fL (ref 80.0–100.0)
Platelets: 190 10*3/uL (ref 150–400)
RBC: 4.69 MIL/uL (ref 3.87–5.11)
RDW: 13 % (ref 11.5–15.5)
WBC: 5.7 10*3/uL (ref 4.0–10.5)
nRBC: 0 % (ref 0.0–0.2)

## 2022-10-24 LAB — PROTIME-INR
INR: 1 (ref 0.8–1.2)
Prothrombin Time: 12.8 seconds (ref 11.4–15.2)

## 2022-10-24 LAB — SURGICAL PCR SCREEN
MRSA, PCR: NEGATIVE
Staphylococcus aureus: NEGATIVE

## 2022-10-24 LAB — TYPE AND SCREEN
ABO/RH(D): B POS
Antibody Screen: NEGATIVE

## 2022-10-24 LAB — APTT: aPTT: 26 seconds (ref 24–36)

## 2022-10-24 LAB — GLUCOSE, CAPILLARY: Glucose-Capillary: 129 mg/dL — ABNORMAL HIGH (ref 70–99)

## 2022-10-24 NOTE — Progress Notes (Signed)
PCP - Dr. Vidal Schwalbe Cardiologist - Dr. Rozann Lesches  PPM/ICD - Denies  Chest x-ray - N/A EKG - 09/16/22 Stress Test - Denies ECHO - 10/01/22 Cardiac Cath - Denies  Sleep Study - Denies  Fasting Blood Sugar - 80 Checks Blood Sugar __3___ times a day  Blood Thinner/ASA Instructions: Per cardiology, stop ASA 5 days prior to sx (LD 10/22/22) and Plavix 7 days prior (10/20/22).  ERAS Protcol - No  COVID TEST- N/A   Anesthesia review: Yes, cardiac hx; clearance 10/20/22  Patient denies shortness of breath, fever, cough and chest pain at PAT appointment   All instructions explained to the patient, with a verbal understanding of the material. Patient agrees to go over the instructions while at home for a better understanding. Patient also instructed to self quarantine after being tested for COVID-19. The opportunity to ask questions was provided.

## 2022-10-25 LAB — HEMOGLOBIN A1C
Hgb A1c MFr Bld: 6.5 % — ABNORMAL HIGH (ref 4.8–5.6)
Mean Plasma Glucose: 140 mg/dL

## 2022-10-27 ENCOUNTER — Encounter (HOSPITAL_COMMUNITY): Payer: Self-pay | Admitting: Emergency Medicine

## 2022-10-27 NOTE — Anesthesia Preprocedure Evaluation (Signed)
Anesthesia Evaluation  Patient identified by MRN, date of birth, ID band Patient awake    Reviewed: Allergy & Precautions, NPO status , Patient's Chart, lab work & pertinent test results  Airway Mallampati: II  TM Distance: >3 FB Neck ROM: Full    Dental  (+) Missing   Pulmonary Current Smoker   Pulmonary exam normal        Cardiovascular hypertension, Pt. on medications and Pt. on home beta blockers + Peripheral Vascular Disease   Rhythm:Regular Rate:Normal  ECHO 02/24:  1. Left ventricular ejection fraction, by estimation, is 50 to 55%. The  left ventricle has low normal function. The left ventricle has no regional  wall motion abnormalities. Left ventricular diastolic parameters are  consistent with Grade I diastolic  dysfunction (impaired relaxation).   2. Right ventricular systolic function is normal. The right ventricular  size is normal. Tricuspid regurgitation signal is inadequate for assessing  PA pressure.   3. The mitral valve is mildly degenerative. Trivial mitral valve  regurgitation.   4. The aortic valve is tricuspid. Aortic valve regurgitation is not  visualized.   5. The inferior vena cava is normal in size with greater than 50%  respiratory variability, suggesting right atrial pressure of 3 mmHg.   Comparison(s): Prior images reviewed side by side. LVEF has improved in  comparison.      Neuro/Psych Carotid stenosis  CVA  negative psych ROS   GI/Hepatic Neg liver ROS,GERD  ,,  Endo/Other  diabetes, Type 2    Renal/GU negative Renal ROS  negative genitourinary   Musculoskeletal  (+) Arthritis , Osteoarthritis,    Abdominal Normal abdominal exam  (+)   Peds  Hematology Lab Results      Component                Value               Date                      WBC                      5.7                 10/24/2022                HGB                      14.9                10/24/2022                 HCT                      42.8                10/24/2022                MCV                      91.3                10/24/2022                PLT                      190  10/24/2022             Lab Results      Component                Value               Date                      NA                       136                 10/24/2022                K                        3.9                 10/24/2022                CO2                      27                  10/24/2022                GLUCOSE                  109 (H)             10/24/2022                BUN                      19                  10/24/2022                CREATININE               1.10 (H)            10/24/2022                CALCIUM                  10.0                10/24/2022                GFRNONAA                 57 (L)              10/24/2022              Anesthesia Other Findings   Reproductive/Obstetrics                             Anesthesia Physical Anesthesia Plan  ASA: 3  Anesthesia Plan: General   Post-op Pain Management:    Induction: Intravenous  PONV Risk Score and Plan: 2 and Ondansetron, Dexamethasone, Midazolam and Treatment may vary due to age or medical condition  Airway Management Planned: Mask and Oral ETT  Additional Equipment: Arterial line  Intra-op Plan:   Post-operative Plan: Extubation in OR  Informed Consent: I have reviewed the patients History and Physical, chart, labs and discussed  the procedure including the risks, benefits and alternatives for the proposed anesthesia with the patient or authorized representative who has indicated his/her understanding and acceptance.     Dental advisory given  Plan Discussed with:   Anesthesia Plan Comments: (See APP note by Durel Salts, FNP )       Anesthesia Quick Evaluation

## 2022-10-27 NOTE — Progress Notes (Signed)
Anesthesia Chart Review:   Case: [id number] Date/Time: 10/28/22 1024   Procedure: ENDARTERECTOMY CAROTID (Right)   Anesthesia type: General   Pre-op diagnosis: Stenosis of right carotid artery   Location: [facility] OR ROOM 12 / [place] [facility]   Surgeons: Waynetta Sandy, MD       DISCUSSION: Pt is 62 years old with hx PAD, stroke, carotid artery disease, HTN, DM  Pt stopped plavix and ASA on 10/24/22 per PAT RN's instructions, however pt was to remain on these meds perioperatively. Spoke with pt's son Simona Huh who will have pt restart meds today 10/27/22. Per staff message communication with Dr. Donzetta Matters, pt can proceed as scheduled.    VS: BP (!) (P) 140/66   Pulse (P) 94   Temp (P) 37.1 C   Resp (P) 17   Ht (P) '5\' 4"'$  (1.626 m)   Wt (P) 79.5 kg   SpO2 (P) 96%   BMI (P) 30.07 kg/m   PROVIDERS: - PCP is Vidal Schwalbe, MD - Cardiologist is Rozann Lesches, MD. Cleared for surgery at low risk at last office visit 10/20/22 with Finis Bud, NP  LABS: Labs reviewed: Acceptable for surgery. (all labs ordered are listed, but only abnormal results are displayed)  Labs Reviewed  GLUCOSE, CAPILLARY - Abnormal; Notable for the following components:      Result Value   Glucose-Capillary 129 (*)    All other components within normal limits  COMPREHENSIVE METABOLIC PANEL - Abnormal; Notable for the following components:   Glucose, Bld 109 (*)    Creatinine, Ser 1.10 (*)    GFR, Estimated 57 (*)    All other components within normal limits  HEMOGLOBIN A1C - Abnormal; Notable for the following components:   Hgb A1c MFr Bld 6.5 (*)    All other components within normal limits  SURGICAL PCR SCREEN  CBC  PROTIME-INR  APTT  TYPE AND SCREEN    EKG 09/16/22: ST, 101 bpm, otherwise no acute ischemic changes.     CV: Bilateral carotid duplex 10-08-22: - Right Carotid: Velocities in the right ICA are consistent with a 80-99% stenosis.  - Left Carotid: There is no evidence of stenosis in the left  ICA.  - Vertebrals:  Bilateral vertebral arteries demonstrate antegrade flow.  - Subclavians: Normal flow hemodynamics were seen in bilateral subclavian arteries.  Echocardiogram 10/01/22: 1. Left ventricular ejection fraction, by estimation, is 50 to 55%. The left ventricle has low normal function. The left ventricle has no regional wall motion abnormalities. Left ventricular diastolic parameters are consistent with Grade I diastolic  dysfunction (impaired relaxation).  2. Right ventricular systolic function is normal. The right ventricular size is normal. Tricuspid regurgitation signal is inadequate for assessing PA pressure.  3. The mitral valve is mildly degenerative. Trivial mitral valve regurgitation.  4. The aortic valve is tricuspid. Aortic valve regurgitation is not visualized.  5. The inferior vena cava is normal in size with greater than 50% respiratory variability, suggesting right atrial pressure of 3 mmHg.  - Comparison(s): Prior images reviewed side by side. LVEF has improved in comparison.   Past Medical History:  Diagnosis Date   Arthritis    Carotid artery disease ([place])    Essential hypertension    GERD (gastroesophageal reflux disease)    History of stroke    2013 and 2014   Mixed hyperlipidemia    Obesity    PAD (peripheral artery disease) ([facility])    Pulmonary nodule    Type 2 diabetes mellitus ([place])  Vitamin D deficiency disease     Past Surgical History:  Procedure Laterality Date   RADIOLOGY WITH ANESTHESIA N/A 10/18/2012   Procedure: RADIOLOGY WITH ANESTHESIA;  Surgeon: Rob Hickman, MD;  Location: [facility];  Service: Radiology;  Laterality: N/A;   TUBAL LIGATION      MEDICATIONS:  acetaminophen (TYLENOL) 500 MG tablet   amLODipine (NORVASC) 5 MG tablet   aspirin EC 81 MG tablet   atorvastatin (LIPITOR) 80 MG tablet   carvedilol (COREG) 6.25 MG tablet   clopidogrel (PLAVIX) 75 MG tablet   dapagliflozin propanediol (FARXIGA) 10 MG TABS tablet    gabapentin (NEURONTIN) 100 MG capsule   glipiZIDE (GLUCOTROL XL) 5 MG 24 hr tablet   glucose monitoring kit (FREESTYLE) monitoring kit   losartan (COZAAR) 50 MG tablet   sitaGLIPtin-metformin (JANUMET) 50-1000 MG tablet   Vitamin D, Ergocalciferol, (DRISDOL) 1.25 MG (50000 UNIT) CAPS capsule   No current facility-administered medications for this encounter.    If no changes, I anticipate pt can proceed with surgery as scheduled.   Willeen Cass, PhD, FNP-BC [facility]/Anesthesiology Phone: [phone] 10/27/2022 1:12 PM

## 2022-10-28 ENCOUNTER — Encounter (HOSPITAL_COMMUNITY)
Admission: RE | Disposition: A | Discharge status: Home / Self Care | Payer: Self-pay | Source: Home / Self Care | Attending: Vascular Surgery

## 2022-10-28 ENCOUNTER — Other Ambulatory Visit: Payer: Self-pay

## 2022-10-28 ENCOUNTER — Encounter (HOSPITAL_COMMUNITY): Payer: Self-pay | Admitting: Vascular Surgery

## 2022-10-28 ENCOUNTER — Inpatient Hospital Stay (HOSPITAL_COMMUNITY): Payer: 59 | Admitting: Emergency Medicine

## 2022-10-28 ENCOUNTER — Inpatient Hospital Stay (HOSPITAL_COMMUNITY)
Admission: RE | Admit: 2022-10-28 | Discharge: 2022-10-29 | DRG: 039 | Disposition: A | Discharge status: Home / Self Care | Payer: 59 | Attending: Vascular Surgery | Admitting: Vascular Surgery

## 2022-10-28 DIAGNOSIS — Z7984 Long term (current) use of oral hypoglycemic drugs: Secondary | ICD-10-CM | POA: Diagnosis not present

## 2022-10-28 DIAGNOSIS — I63231 Cerebral infarction due to unspecified occlusion or stenosis of right carotid arteries: Secondary | ICD-10-CM

## 2022-10-28 DIAGNOSIS — Z7902 Long term (current) use of antithrombotics/antiplatelets: Secondary | ICD-10-CM | POA: Diagnosis not present

## 2022-10-28 DIAGNOSIS — I6619 Occlusion and stenosis of unspecified anterior cerebral artery: Secondary | ICD-10-CM | POA: Diagnosis present

## 2022-10-28 DIAGNOSIS — Z7982 Long term (current) use of aspirin: Secondary | ICD-10-CM | POA: Diagnosis not present

## 2022-10-28 DIAGNOSIS — I6521 Occlusion and stenosis of right carotid artery: Secondary | ICD-10-CM | POA: Diagnosis present

## 2022-10-28 DIAGNOSIS — I69331 Monoplegia of upper limb following cerebral infarction affecting right dominant side: Secondary | ICD-10-CM | POA: Diagnosis not present

## 2022-10-28 DIAGNOSIS — Z87891 Personal history of nicotine dependence: Secondary | ICD-10-CM

## 2022-10-28 DIAGNOSIS — Z888 Allergy status to other drugs, medicaments and biological substances status: Secondary | ICD-10-CM

## 2022-10-28 DIAGNOSIS — Z79899 Other long term (current) drug therapy: Secondary | ICD-10-CM | POA: Diagnosis not present

## 2022-10-28 DIAGNOSIS — E782 Mixed hyperlipidemia: Secondary | ICD-10-CM | POA: Diagnosis present

## 2022-10-28 DIAGNOSIS — F17210 Nicotine dependence, cigarettes, uncomplicated: Secondary | ICD-10-CM

## 2022-10-28 DIAGNOSIS — E1151 Type 2 diabetes mellitus with diabetic peripheral angiopathy without gangrene: Secondary | ICD-10-CM | POA: Diagnosis present

## 2022-10-28 DIAGNOSIS — Z8249 Family history of ischemic heart disease and other diseases of the circulatory system: Secondary | ICD-10-CM

## 2022-10-28 DIAGNOSIS — I10 Essential (primary) hypertension: Secondary | ICD-10-CM | POA: Diagnosis present

## 2022-10-28 DIAGNOSIS — K219 Gastro-esophageal reflux disease without esophagitis: Secondary | ICD-10-CM | POA: Diagnosis present

## 2022-10-28 DIAGNOSIS — E119 Type 2 diabetes mellitus without complications: Secondary | ICD-10-CM

## 2022-10-28 HISTORY — PX: PATCH ANGIOPLASTY: SHX6230

## 2022-10-28 HISTORY — PX: ENDARTERECTOMY: SHX5162

## 2022-10-28 LAB — URINALYSIS, ROUTINE W REFLEX MICROSCOPIC
Bacteria, UA: NONE SEEN
Bilirubin Urine: NEGATIVE
Glucose, UA: 50 mg/dL — AB
Hgb urine dipstick: NEGATIVE
Ketones, ur: NEGATIVE mg/dL
Nitrite: NEGATIVE
Protein, ur: NEGATIVE mg/dL
Specific Gravity, Urine: 1.014 (ref 1.005–1.030)
pH: 5 (ref 5.0–8.0)

## 2022-10-28 LAB — GLUCOSE, CAPILLARY
Glucose-Capillary: 81 mg/dL (ref 70–99)
Glucose-Capillary: 97 mg/dL (ref 70–99)
Glucose-Capillary: 135 mg/dL — ABNORMAL HIGH (ref 70–99)
Glucose-Capillary: 153 mg/dL — ABNORMAL HIGH (ref 70–99)
Glucose-Capillary: 194 mg/dL — ABNORMAL HIGH (ref 70–99)
Glucose-Capillary: 210 mg/dL — ABNORMAL HIGH (ref 70–99)

## 2022-10-28 LAB — CBC
HCT: 34.2 % — ABNORMAL LOW (ref 36.0–46.0)
Hemoglobin: 12.2 g/dL (ref 12.0–15.0)
MCH: 32 pg (ref 26.0–34.0)
MCHC: 35.7 g/dL (ref 30.0–36.0)
MCV: 89.8 fL (ref 80.0–100.0)
Platelets: 152 10*3/uL (ref 150–400)
RBC: 3.81 MIL/uL — ABNORMAL LOW (ref 3.87–5.11)
RDW: 12.9 % (ref 11.5–15.5)
WBC: 10.1 10*3/uL (ref 4.0–10.5)
nRBC: 0 % (ref 0.0–0.2)

## 2022-10-28 LAB — CREATININE, SERUM
Creatinine, Ser: 0.89 mg/dL (ref 0.44–1.00)
GFR, Estimated: >60 mL/min (ref >60)

## 2022-10-28 LAB — ABO/RH: ABO/RH(D): B POS

## 2022-10-28 LAB — POCT ACTIVATED CLOTTING TIME: Activated Clotting Time: 342 s

## 2022-10-28 SURGERY — ENDARTERECTOMY, CAROTID
Anesthesia: General | Site: Neck | Laterality: Right

## 2022-10-28 MED ORDER — ACETAMINOPHEN 10 MG/ML IV SOLN
1000.0000 mg | Freq: Once | INTRAVENOUS | Status: DC | PRN
  Administered 2022-10-28: 1000 mg via INTRAVENOUS

## 2022-10-28 MED ORDER — SUGAMMADEX SODIUM 200 MG/2ML IV SOLN
INTRAVENOUS | Status: DC | PRN
  Administered 2022-10-28: 160 mg via INTRAVENOUS

## 2022-10-28 MED ORDER — CLOPIDOGREL BISULFATE 75 MG PO TABS
75.0000 mg | ORAL_TABLET | Freq: Every day | ORAL | Status: DC
  Administered 2022-10-29: 75 mg via ORAL
  Filled 2022-10-28: qty 1

## 2022-10-28 MED ORDER — SUGAMMADEX SODIUM 500 MG/5ML IV SOLN
INTRAVENOUS | Status: AC
  Filled 2022-10-28: qty 5

## 2022-10-28 MED ORDER — DEXAMETHASONE SODIUM PHOSPHATE 10 MG/ML IJ SOLN
INTRAMUSCULAR | Status: DC | PRN
  Administered 2022-10-28: 5 mg via INTRAVENOUS

## 2022-10-28 MED ORDER — ONDANSETRON HCL 4 MG/2ML IJ SOLN
INTRAMUSCULAR | Status: AC
  Filled 2022-10-28: qty 2

## 2022-10-28 MED ORDER — ORAL CARE MOUTH RINSE: 15.0000 mL | Freq: Once | OROMUCOSAL | Status: AC

## 2022-10-28 MED ORDER — INSULIN ASPART 100 UNIT/ML IJ SOLN
0.0000 [IU] | Freq: Three times a day (TID) | INTRAMUSCULAR | Status: DC
  Administered 2022-10-29: 2 [IU] via SUBCUTANEOUS
  Administered 2022-10-29: 3 [IU] via SUBCUTANEOUS

## 2022-10-28 MED ORDER — LABETALOL HCL 5 MG/ML IV SOLN
10.0000 mg | INTRAVENOUS | Status: DC | PRN
  Administered 2022-10-28 (×2): 10 mg via INTRAVENOUS
  Filled 2022-10-28 (×2): qty 4

## 2022-10-28 MED ORDER — HYDRALAZINE HCL 20 MG/ML IJ SOLN
INTRAMUSCULAR | Status: AC
  Filled 2022-10-28: qty 1

## 2022-10-28 MED ORDER — OXYCODONE-ACETAMINOPHEN 5-325 MG PO TABS
1.0000 | ORAL_TABLET | ORAL | Status: DC | PRN
  Filled 2022-10-28: qty 1

## 2022-10-28 MED ORDER — REMIFENTANIL HCL 2 MG IV SOLR
INTRAVENOUS | Status: DC | PRN
  Administered 2022-10-28: .1 ug/kg/min via INTRAVENOUS

## 2022-10-28 MED ORDER — ACETAMINOPHEN 10 MG/ML IV SOLN
INTRAVENOUS | Status: AC
  Filled 2022-10-28: qty 100

## 2022-10-28 MED ORDER — ALUM & MAG HYDROXIDE-SIMETH 200-200-20 MG/5ML PO SUSP: 15.0000 mL | ORAL | Status: DC | PRN

## 2022-10-28 MED ORDER — FENTANYL CITRATE (PF) 250 MCG/5ML IJ SOLN
INTRAMUSCULAR | Status: AC
  Filled 2022-10-28: qty 5

## 2022-10-28 MED ORDER — LINAGLIPTIN 5 MG PO TABS
5.0000 mg | ORAL_TABLET | Freq: Every day | ORAL | Status: DC
  Administered 2022-10-29: 5 mg via ORAL
  Filled 2022-10-28: qty 1

## 2022-10-28 MED ORDER — CHLORHEXIDINE GLUCONATE CLOTH 2 % EX PADS: 6.0000 | MEDICATED_PAD | Freq: Once | CUTANEOUS | Status: DC

## 2022-10-28 MED ORDER — SODIUM CHLORIDE 0.9 % IV SOLN: INTRAVENOUS | Status: DC

## 2022-10-28 MED ORDER — MAGNESIUM SULFATE 2 GM/50ML IV SOLN: 2.0000 g | Freq: Every day | INTRAVENOUS | Status: DC | PRN

## 2022-10-28 MED ORDER — LACTATED RINGERS IV SOLN: INTRAVENOUS | Status: DC | PRN

## 2022-10-28 MED ORDER — HYDRALAZINE HCL 20 MG/ML IJ SOLN
10.0000 mg | Freq: Once | INTRAMUSCULAR | Status: AC
  Administered 2022-10-28: 10 mg via INTRAVENOUS

## 2022-10-28 MED ORDER — DAPAGLIFLOZIN PROPANEDIOL 10 MG PO TABS
10.0000 mg | ORAL_TABLET | Freq: Every day | ORAL | Status: DC
  Administered 2022-10-28 – 2022-10-29 (×2): 10 mg via ORAL
  Filled 2022-10-28 (×2): qty 1

## 2022-10-28 MED ORDER — PROPOFOL 10 MG/ML IV BOLUS
INTRAVENOUS | Status: AC
  Filled 2022-10-28: qty 20

## 2022-10-28 MED ORDER — HEPARIN SODIUM (PORCINE) 5000 UNIT/ML IJ SOLN
5000.0000 [IU] | Freq: Three times a day (TID) | INTRAMUSCULAR | Status: DC
  Administered 2022-10-29: 5000 [IU] via SUBCUTANEOUS
  Filled 2022-10-28: qty 1

## 2022-10-28 MED ORDER — ONDANSETRON HCL 4 MG/2ML IJ SOLN: 4.0000 mg | Freq: Four times a day (QID) | INTRAMUSCULAR | Status: DC | PRN

## 2022-10-28 MED ORDER — ASPIRIN 81 MG PO TBEC
DELAYED_RELEASE_TABLET | ORAL | Status: AC
  Administered 2022-10-28: 81 mg via ORAL
  Filled 2022-10-28: qty 1

## 2022-10-28 MED ORDER — LACTATED RINGERS IV SOLN: INTRAVENOUS | Status: DC

## 2022-10-28 MED ORDER — CARVEDILOL 6.25 MG PO TABS
6.2500 mg | ORAL_TABLET | Freq: Two times a day (BID) | ORAL | Status: DC
  Administered 2022-10-28 – 2022-10-29 (×2): 6.25 mg via ORAL
  Filled 2022-10-28 (×2): qty 1

## 2022-10-28 MED ORDER — CARVEDILOL 3.125 MG PO TABS: 6.2500 mg | ORAL_TABLET | Freq: Once | ORAL | Status: AC

## 2022-10-28 MED ORDER — MORPHINE SULFATE (PF) 2 MG/ML IV SOLN
2.0000 mg | INTRAVENOUS | Status: DC | PRN
  Administered 2022-10-28: 2 mg via INTRAVENOUS
  Filled 2022-10-28: qty 1

## 2022-10-28 MED ORDER — METFORMIN HCL 500 MG PO TABS
500.0000 mg | ORAL_TABLET | Freq: Two times a day (BID) | ORAL | Status: DC
  Administered 2022-10-29: 500 mg via ORAL
  Filled 2022-10-28: qty 1

## 2022-10-28 MED ORDER — SODIUM CHLORIDE 0.9 % IV SOLN: 500.0000 mL | Freq: Once | INTRAVENOUS | Status: DC | PRN

## 2022-10-28 MED ORDER — DOCUSATE SODIUM 100 MG PO CAPS
100.0000 mg | ORAL_CAPSULE | Freq: Every day | ORAL | Status: DC
  Administered 2022-10-29: 100 mg via ORAL
  Filled 2022-10-28: qty 1

## 2022-10-28 MED ORDER — GLYCOPYRROLATE PF 0.2 MG/ML IJ SOSY
PREFILLED_SYRINGE | INTRAMUSCULAR | Status: AC
  Filled 2022-10-28: qty 1

## 2022-10-28 MED ORDER — 0.9 % SODIUM CHLORIDE (POUR BTL) OPTIME
TOPICAL | Status: DC | PRN
  Administered 2022-10-28: 2000 mL

## 2022-10-28 MED ORDER — AMLODIPINE BESYLATE 5 MG PO TABS
5.0000 mg | ORAL_TABLET | Freq: Every day | ORAL | Status: DC
  Administered 2022-10-28 – 2022-10-29 (×2): 5 mg via ORAL
  Filled 2022-10-28 (×2): qty 1

## 2022-10-28 MED ORDER — CEFAZOLIN SODIUM-DEXTROSE 2-4 GM/100ML-% IV SOLN
2.0000 g | INTRAVENOUS | Status: AC
  Administered 2022-10-28: 2 g via INTRAVENOUS
  Filled 2022-10-28: qty 100

## 2022-10-28 MED ORDER — LIDOCAINE 2% (20 MG/ML) 5 ML SYRINGE
INTRAMUSCULAR | Status: DC | PRN
  Administered 2022-10-28: 60 mg via INTRAVENOUS

## 2022-10-28 MED ORDER — FENTANYL CITRATE (PF) 100 MCG/2ML IJ SOLN
25.0000 ug | INTRAMUSCULAR | Status: DC | PRN
  Administered 2022-10-28 (×3): 50 ug via INTRAVENOUS

## 2022-10-28 MED ORDER — GLIPIZIDE ER 5 MG PO TB24
5.0000 mg | ORAL_TABLET | Freq: Every day | ORAL | Status: DC
  Administered 2022-10-29: 5 mg via ORAL
  Filled 2022-10-28: qty 1

## 2022-10-28 MED ORDER — HEPARIN 6000 UNIT IRRIGATION SOLUTION
Status: DC | PRN
  Administered 2022-10-28: 1

## 2022-10-28 MED ORDER — ONDANSETRON HCL 4 MG/2ML IJ SOLN
INTRAMUSCULAR | Status: DC | PRN
  Administered 2022-10-28: 4 mg via INTRAVENOUS

## 2022-10-28 MED ORDER — FENTANYL CITRATE (PF) 100 MCG/2ML IJ SOLN
INTRAMUSCULAR | Status: AC
  Filled 2022-10-28: qty 2

## 2022-10-28 MED ORDER — HEMOSTATIC AGENTS (NO CHARGE) OPTIME
TOPICAL | Status: DC | PRN
  Administered 2022-10-28: 1 via TOPICAL

## 2022-10-28 MED ORDER — ROCURONIUM BROMIDE 10 MG/ML (PF) SYRINGE
PREFILLED_SYRINGE | INTRAVENOUS | Status: DC | PRN
  Administered 2022-10-28: 60 mg via INTRAVENOUS
  Administered 2022-10-28: 10 mg via INTRAVENOUS

## 2022-10-28 MED ORDER — HYDRALAZINE HCL 20 MG/ML IJ SOLN: 5.0000 mg | INTRAMUSCULAR | Status: DC | PRN

## 2022-10-28 MED ORDER — PHENYLEPHRINE 80 MCG/ML (10ML) SYRINGE FOR IV PUSH (FOR BLOOD PRESSURE SUPPORT)
PREFILLED_SYRINGE | INTRAVENOUS | Status: DC | PRN
  Administered 2022-10-28 (×5): 80 ug via INTRAVENOUS

## 2022-10-28 MED ORDER — CARVEDILOL 3.125 MG PO TABS
ORAL_TABLET | ORAL | Status: AC
  Administered 2022-10-28: 6.25 mg via ORAL
  Filled 2022-10-28: qty 2

## 2022-10-28 MED ORDER — PROTAMINE SULFATE 10 MG/ML IV SOLN
INTRAVENOUS | Status: DC | PRN
  Administered 2022-10-28: 10 mg via INTRAVENOUS
  Administered 2022-10-28: 40 mg via INTRAVENOUS

## 2022-10-28 MED ORDER — INSULIN ASPART 100 UNIT/ML IJ SOLN
0.0000 [IU] | Freq: Every day | INTRAMUSCULAR | Status: DC
  Administered 2022-10-28: 2 [IU] via SUBCUTANEOUS

## 2022-10-28 MED ORDER — HEPARIN 6000 UNIT IRRIGATION SOLUTION
Status: AC
  Filled 2022-10-28: qty 500

## 2022-10-28 MED ORDER — LIDOCAINE 2% (20 MG/ML) 5 ML SYRINGE
INTRAMUSCULAR | Status: AC
  Filled 2022-10-28: qty 5

## 2022-10-28 MED ORDER — SITAGLIPTIN PHOS-METFORMIN HCL 50-1000 MG PO TABS: 1.0000 | ORAL_TABLET | Freq: Two times a day (BID) | ORAL | Status: DC

## 2022-10-28 MED ORDER — HEPARIN SODIUM (PORCINE) 1000 UNIT/ML IJ SOLN
INTRAMUSCULAR | Status: DC | PRN
  Administered 2022-10-28: 8000 [IU] via INTRAVENOUS

## 2022-10-28 MED ORDER — GABAPENTIN 100 MG PO CAPS
100.0000 mg | ORAL_CAPSULE | Freq: Three times a day (TID) | ORAL | Status: DC
  Administered 2022-10-28 – 2022-10-29 (×3): 100 mg via ORAL
  Filled 2022-10-28 (×3): qty 1

## 2022-10-28 MED ORDER — CLEVIDIPINE BUTYRATE 0.5 MG/ML IV EMUL
INTRAVENOUS | Status: DC | PRN
  Administered 2022-10-28: 4 mg/h via INTRAVENOUS

## 2022-10-28 MED ORDER — PHENOL 1.4 % MT LIQD: 1.0000 | OROMUCOSAL | Status: DC | PRN

## 2022-10-28 MED ORDER — LABETALOL HCL 5 MG/ML IV SOLN
INTRAVENOUS | Status: DC | PRN
  Administered 2022-10-28: 5 mg via INTRAVENOUS

## 2022-10-28 MED ORDER — ASPIRIN 81 MG PO TBEC
81.0000 mg | DELAYED_RELEASE_TABLET | Freq: Every day | ORAL | Status: DC
  Administered 2022-10-29: 81 mg via ORAL
  Filled 2022-10-28: qty 1

## 2022-10-28 MED ORDER — METOPROLOL TARTRATE 5 MG/5ML IV SOLN: 2.0000 mg | INTRAVENOUS | Status: DC | PRN

## 2022-10-28 MED ORDER — ROCURONIUM BROMIDE 10 MG/ML (PF) SYRINGE
PREFILLED_SYRINGE | INTRAVENOUS | Status: AC
  Filled 2022-10-28: qty 10

## 2022-10-28 MED ORDER — PROPOFOL 10 MG/ML IV BOLUS
INTRAVENOUS | Status: DC | PRN
  Administered 2022-10-28: 20 mg via INTRAVENOUS
  Administered 2022-10-28: 140 mg via INTRAVENOUS
  Administered 2022-10-28: 20 mg via INTRAVENOUS

## 2022-10-28 MED ORDER — CHLORHEXIDINE GLUCONATE 0.12 % MT SOLN
15.0000 mL | Freq: Once | OROMUCOSAL | Status: AC
  Administered 2022-10-28: 15 mL via OROMUCOSAL
  Filled 2022-10-28: qty 15

## 2022-10-28 MED ORDER — PANTOPRAZOLE SODIUM 40 MG PO TBEC
40.0000 mg | DELAYED_RELEASE_TABLET | Freq: Every day | ORAL | Status: DC
  Administered 2022-10-28 – 2022-10-29 (×2): 40 mg via ORAL
  Filled 2022-10-28 (×2): qty 1

## 2022-10-28 MED ORDER — ASPIRIN 81 MG PO TBEC: 81.0000 mg | DELAYED_RELEASE_TABLET | Freq: Once | ORAL | Status: AC

## 2022-10-28 MED ORDER — GUAIFENESIN-DM 100-10 MG/5ML PO SYRP: 15.0000 mL | ORAL_SOLUTION | ORAL | Status: DC | PRN

## 2022-10-28 MED ORDER — PHENYLEPHRINE HCL-NACL 20-0.9 MG/250ML-% IV SOLN
INTRAVENOUS | Status: DC | PRN
  Administered 2022-10-28: 50 ug/min via INTRAVENOUS

## 2022-10-28 MED ORDER — ACETAMINOPHEN 650 MG RE SUPP: 325.0000 mg | RECTAL | Status: DC | PRN

## 2022-10-28 MED ORDER — ACETAMINOPHEN 325 MG PO TABS: 325.0000 mg | ORAL_TABLET | ORAL | Status: DC | PRN

## 2022-10-28 MED ORDER — LOSARTAN POTASSIUM 50 MG PO TABS
50.0000 mg | ORAL_TABLET | Freq: Every day | ORAL | Status: DC
  Administered 2022-10-28 – 2022-10-29 (×2): 50 mg via ORAL
  Filled 2022-10-28 (×2): qty 1

## 2022-10-28 MED ORDER — POTASSIUM CHLORIDE CRYS ER 20 MEQ PO TBCR: 20.0000 meq | EXTENDED_RELEASE_TABLET | Freq: Every day | ORAL | Status: DC | PRN

## 2022-10-28 MED ORDER — LIDOCAINE HCL (PF) 1 % IJ SOLN
INTRAMUSCULAR | Status: AC
  Filled 2022-10-28: qty 30

## 2022-10-28 MED ORDER — FENTANYL CITRATE (PF) 250 MCG/5ML IJ SOLN
INTRAMUSCULAR | Status: DC | PRN
  Administered 2022-10-28: 100 ug via INTRAVENOUS
  Administered 2022-10-28: 50 ug via INTRAVENOUS

## 2022-10-28 MED ORDER — CEFAZOLIN SODIUM-DEXTROSE 2-4 GM/100ML-% IV SOLN
2.0000 g | Freq: Three times a day (TID) | INTRAVENOUS | Status: AC
  Administered 2022-10-28 – 2022-10-29 (×2): 2 g via INTRAVENOUS
  Filled 2022-10-28 (×2): qty 100

## 2022-10-28 MED ORDER — ATORVASTATIN CALCIUM 80 MG PO TABS
80.0000 mg | ORAL_TABLET | Freq: Every day | ORAL | Status: DC
  Administered 2022-10-28 – 2022-10-29 (×2): 80 mg via ORAL
  Filled 2022-10-28 (×2): qty 1

## 2022-10-28 MED ORDER — DEXAMETHASONE SODIUM PHOSPHATE 10 MG/ML IJ SOLN
INTRAMUSCULAR | Status: AC
  Filled 2022-10-28: qty 1

## 2022-10-28 MED ORDER — REMIFENTANIL HCL 2 MG IV SOLR
INTRAVENOUS | Status: AC
  Filled 2022-10-28: qty 2000

## 2022-10-28 MED ORDER — GLYCOPYRROLATE PF 0.2 MG/ML IJ SOSY
PREFILLED_SYRINGE | INTRAMUSCULAR | Status: DC | PRN
  Administered 2022-10-28 (×2): .1 mg via INTRAVENOUS

## 2022-10-28 SURGICAL SUPPLY — 50 items
ADH SKN CLS APL DERMABOND .7 (GAUZE/BANDAGES/DRESSINGS) ×1
ADPR TBG 2 MALE LL ART (MISCELLANEOUS)
BAG COUNTER SPONGE SURGICOUNT (BAG) ×1 IMPLANT
BAG DECANTER FOR FLEXI CONT (MISCELLANEOUS) ×1 IMPLANT
BAG SPNG CNTER NS LX DISP (BAG) ×1
CANISTER SUCT 3000ML PPV (MISCELLANEOUS) ×1 IMPLANT
CATH ROBINSON RED A/P 18FR (CATHETERS) ×1 IMPLANT
CLIP LIGATING EXTRA MED SLVR (CLIP) ×1 IMPLANT
CLIP LIGATING EXTRA SM BLUE (MISCELLANEOUS) ×1 IMPLANT
COVER PROBE W GEL 5X96 (DRAPES) ×1 IMPLANT
DERMABOND ADVANCED .7 DNX12 (GAUZE/BANDAGES/DRESSINGS) ×1 IMPLANT
ELECT REM PT RETURN 9FT ADLT (ELECTROSURGICAL) ×1
ELECTRODE REM PT RTRN 9FT ADLT (ELECTROSURGICAL) ×1 IMPLANT
GLOVE BIO SURGEON STRL SZ7.5 (GLOVE) ×1 IMPLANT
GOWN STRL REUS W/ TWL LRG LVL3 (GOWN DISPOSABLE) ×2 IMPLANT
GOWN STRL REUS W/ TWL XL LVL3 (GOWN DISPOSABLE) ×1 IMPLANT
GOWN STRL REUS W/TWL LRG LVL3 (GOWN DISPOSABLE) ×2
GOWN STRL REUS W/TWL XL LVL3 (GOWN DISPOSABLE) ×1
HEMOSTAT SNOW SURGICEL 2X4 (HEMOSTASIS) IMPLANT
INSERT FOGARTY SM (MISCELLANEOUS) IMPLANT
IV ADAPTER SYR DOUBLE MALE LL (MISCELLANEOUS) IMPLANT
KIT BASIN OR (CUSTOM PROCEDURE TRAY) ×1 IMPLANT
KIT SHUNT ARGYLE CAROTID ART 6 (VASCULAR PRODUCTS) ×1 IMPLANT
KIT TURNOVER KIT B (KITS) ×1 IMPLANT
LOOP VASCULAR MINI 18 RED (MISCELLANEOUS) ×1
NDL HYPO 25GX1X1/2 BEV (NEEDLE) IMPLANT
NDL SPNL 20GX3.5 QUINCKE YW (NEEDLE) IMPLANT
NEEDLE HYPO 25GX1X1/2 BEV (NEEDLE) IMPLANT
NEEDLE SPNL 20GX3.5 QUINCKE YW (NEEDLE) IMPLANT
NS IRRIG 1000ML POUR BTL (IV SOLUTION) ×3 IMPLANT
PACK CAROTID (CUSTOM PROCEDURE TRAY) ×1 IMPLANT
PAD ARMBOARD 7.5X6 YLW CONV (MISCELLANEOUS) ×2 IMPLANT
PATCH VASC XENOSURE 1CMX6CM (Vascular Products) ×1 IMPLANT
PATCH VASC XENOSURE 1X6 (Vascular Products) IMPLANT
POSITIONER HEAD DONUT 9IN (MISCELLANEOUS) ×1 IMPLANT
POWDER SURGICEL 3.0 GRAM (HEMOSTASIS) IMPLANT
SHUNT CAROTID FLEXCEL 14.5 (SHUNT) IMPLANT
STOPCOCK 4 WAY LG BORE MALE ST (IV SETS) IMPLANT
SUT ETHILON 3 0 PS 1 (SUTURE) IMPLANT
SUT MNCRL AB 4-0 PS2 18 (SUTURE) ×1 IMPLANT
SUT PROLENE 6 0 BV (SUTURE) ×1 IMPLANT
SUT SILK 3 0 (SUTURE)
SUT SILK 3-0 18XBRD TIE 12 (SUTURE) IMPLANT
SUT VIC AB 3-0 SH 27 (SUTURE) ×1
SUT VIC AB 3-0 SH 27X BRD (SUTURE) ×1 IMPLANT
SYR CONTROL 10ML LL (SYRINGE) IMPLANT
TOWEL GREEN STERILE (TOWEL DISPOSABLE) ×1 IMPLANT
TUBING ART PRESS 48 MALE/FEM (TUBING) IMPLANT
VASCULAR TIE MINI RED 18IN STL (MISCELLANEOUS) IMPLANT
WATER STERILE IRR 1000ML POUR (IV SOLUTION) ×1 IMPLANT

## 2022-10-28 NOTE — Anesthesia Procedure Notes (Signed)
Arterial Line Insertion Start/End3/07/2023 10:30 AM Performed by: Valda Favia, CRNA, CRNA  Patient location: Pre-op. Preanesthetic checklist: patient identified, IV checked and risks and benefits discussed Lidocaine 1% used for infiltration Right, radial was placed Catheter size: 20 G Hand hygiene performed   Attempts: 1 Procedure performed without using ultrasound guided technique. Following insertion, dressing applied and Biopatch. Post procedure assessment: unchanged  Patient tolerated the procedure well with no immediate complications.

## 2022-10-28 NOTE — Op Note (Signed)
Patient name: Felicia Chavez MRN: [id number] DOB: [birth year] Sex: female  10/28/2022 Pre-operative Diagnosis: Asymptomatic right internal carotid artery stenosis Post-operative diagnosis:  Same Surgeon:  Erlene Quan C. 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  She has been medically managed and we have discussed the options of continued medical management versus carotid endarterectomy versus stenting and she is agreeable to proceed with carotid endarterectomy.  Experience assistants were necessary given the difficulty of this case given the depth of the carotid artery itself as well as the high nature of the lesion requiring division of the digastric muscle for mobilization of the hypoglossal nerve and division of multiple small vein branches overlying the internal carotid artery.  Findings: The right carotid bifurcation was located very high and required division of the digastric just from visualization of the bifurcation.  The hypoglossal nerve was mobilized and the ansa cervicalis was divided for better visualization as well.  The lesion itself was very focal just at the bifurcation and at completion the patch angioplasty measured only approximately 3 cm in length and at completion there was very strong signal with low resistance distal to the patch angioplasty site.  A 10 French shunt was placed for neuroprotection during the procedure as the backbleeding was minimal in a patient with known contralateral ACA occlusion.   Procedure:  The patient was identified in the holding area and taken to the operating room where she was placed upon operative table and general anesthesia  was induced.  She was sterilely prepped and draped in the right neck in the usual fashion, antibiotics were administered a timeout was called.  An incision was made along the border of the right sternocleidomastoid muscle we dissected down through the subcutaneous tissue and platysma with and then identified the sternocleidomastoid retracted this laterally.  We identified the IJ as well as multiple facial vein branches and these were divided between ties.  We identified the common carotid artery encircled this with umbilical tape and the patient was fully heparinized.  We then dissected higher.  Attempted to identify the hypoglossal nerve but ultimately required sharp division of the digastric muscle.  The hypoglossal nerve was ultimately identified this was noted to be very high in the wound bed.  We then I dissected free the external branches of the carotid artery and placed Vesseloops around these.  The hypoglossal was identified up to the level of the ansa cervicalis and the ansa was divided between clips.  There were multiple overlying veins that were divided between clips and ties for better exposure of the ICA.  Ultimately approximately a centimeter and half past the bifurcation the ICA appeared normal.  The ACT had returned greater than 300.  We then clamped the ICA followed by the common carotid artery and then since the Vesseloops on the external branches.  The common carotid artery was opened longitudinally onto the ICA.  There was minimal backbleeding with a very focal lesion just at the ICA bifurcation.  A 10 [place] shunt had been prepared this was placed distally and then allowed to backbleed and placed proximally.  Doppler confirmed flow throughout the shunt.  We then proceeded with endarterectomy.  Distally we had very smooth  tapering.  We then performed patch angioplasty with bovine pericardium using 6-0 Prolene suture.  Prior completion thoroughly irrigated the carotid bulb.  After this was  completed anastomosis and then remove the clamp first on the external carotid artery branches followed by the common carotid artery branches then completed our patch angioplasty and remove the clamp on the ICA.  There was very strong signal distally in the ICA.  With this we administered 50 mg of protamine and then obtain hemostasis in the wound.  We then thoroughly irrigated the wound after hemostasis was obtained we closed the wound in layers with Vicryl at the level of the platysma and Monocryl at the level of the skin.  Dermabond was placed at the skin site.  The patient was then awakened from anesthesia having tolerated procedure without any complication.  Patient was neurologically intact and transferred recovery in stable condition.  All counts were correct at completion.  EBL: 100 cc     Kylei Purington C. Donzetta Matters, MD [facility] Office: [phone] Pager: [phone]

## 2022-10-28 NOTE — Transfer of Care (Signed)
Immediate Anesthesia Transfer of Care Note  Patient: Felicia Chavez  Procedure(s) Performed: ENDARTERECTOMY CAROTID (Right: Neck) PATCH ANGIOPLASTY USING 1CM X 6CM XENOSURE BIOLOGIC PATCH (Right: Neck)  Patient Location: PACU  Anesthesia Type:General  Level of Consciousness: drowsy and patient cooperative  Airway & Oxygen Therapy: Patient Spontanous Breathing and Patient connected to face mask oxygen  Post-op Assessment: Report given to RN, Post -op Vital signs reviewed and stable, Patient moving all extremities X 4, and Patient able to stick tongue midline  Post vital signs: Reviewed and stable  Last Vitals:  Vitals Value Taken Time  BP 166/85 10/28/22 1446  Temp    Pulse 99 10/28/22 1451  Resp 20 10/28/22 1451  SpO2 94 % 10/28/22 1451  Vitals shown include unvalidated device data.  Last Pain:  Vitals:   10/28/22 0907  TempSrc:   PainSc: 0-No pain         Complications: No notable events documented.

## 2022-10-28 NOTE — Anesthesia Procedure Notes (Signed)
Procedure Name: Intubation Date/Time: 10/28/2022 11:50 AM  Performed by: Janene Harvey, CRNAPre-anesthesia Checklist: Patient identified, Emergency Drugs available, Suction available and Patient being monitored Patient Re-evaluated:Patient Re-evaluated prior to induction Oxygen Delivery Method: Circle system utilized Preoxygenation: Pre-oxygenation with 100% oxygen Induction Type: IV induction Ventilation: Mask ventilation without difficulty Laryngoscope Size: Glidescope and 3 Grade View: Grade I Tube type: Oral Tube size: 7.0 mm Number of attempts: 1 Airway Equipment and Method: Stylet and Oral airway Placement Confirmation: ETT inserted through vocal cords under direct vision, positive ETCO2 and breath sounds checked- equal and bilateral Secured at: 21 cm Tube secured with: Tape Dental Injury: Teeth and Oropharynx as per pre-operative assessment  Comments: DL with MAC 4, grade IV view, esophageal intubation, DL with glidescope 3, grade II view, successful ett placement

## 2022-10-28 NOTE — Interval H&P Note (Signed)
History and Physical Interval Note:  10/28/2022 10:43 AM  Felicia Chavez  has presented today for surgery, with the diagnosis of Stenosis of right carotid artery.  The various methods of treatment have been discussed with the patient and family. After consideration of risks, benefits and other options for treatment, the patient has consented to  Procedure(s): ENDARTERECTOMY CAROTID (Right) as a surgical intervention.  The patient's history has been reviewed, patient examined, no change in status, stable for surgery.  I have reviewed the patient's chart and labs.  Questions were answered to the patient's satisfaction.     Servando Snare

## 2022-10-28 NOTE — Discharge Instructions (Signed)
   [facility]  Discharge Instructions   Carotid Surgery  Please refer to the following instructions for your post-procedure care. Your surgeon or physician assistant will discuss any changes with you.  Activity  You are encouraged to walk as much as you can. You can slowly return to normal activities but must avoid strenuous activity and heavy lifting until your doctor tell you it's okay. Avoid activities such as vacuuming or swinging a golf club. You can drive after one week if you are comfortable and you are no longer taking prescription pain medications. It is normal to feel tired for serval weeks after your surgery. It is also normal to have difficulty with sleep habits, eating, and bowel movements after surgery. These will go away with time.  Bathing/Showering  Shower daily after you go home. Do not soak in a bathtub, hot tub, or swim until the incision heals completely.  Incision Care  Shower every day. Clean your incision with mild soap and water. Pat the area dry with a clean towel. You do not need a bandage unless otherwise instructed. Do not apply any ointments or creams to your incision. You may have skin glue on your incision. Do not peel it off. It will come off on its own in about one week. Your incision may feel thickened and raised for several weeks after your surgery. This is normal and the skin will soften over time.   For Men Only: It's okay to shave around the incision but do not shave the incision itself for 2 weeks. It is common to have numbness under your chin that could last for several months.  Diet  Resume your normal diet. There are no special food restrictions following this procedure. A low fat/low cholesterol diet is recommended for all patients with vascular disease. In order to heal from your surgery, it is CRITICAL to get adequate nutrition. Your body requires vitamins, minerals, and protein. Vegetables are the best source of  vitamins and minerals. Vegetables also provide the perfect balance of protein. Processed food has little nutritional value, so try to avoid this.  Medications  Resume taking all of your medications unless your doctor or physician assistant tells you not to. If your incision is causing pain, you may take over-the- counter pain relievers such as acetaminophen (Tylenol). If you were prescribed a stronger pain medication, please be aware these medications can cause nausea and constipation. Prevent nausea by taking the medication with a snack or meal. Avoid constipation by drinking plenty of fluids and eating foods with a high amount of fiber, such as fruits, vegetables, and grains.   Do not take Tylenol if you are taking prescription pain medications.  Follow Up  Our office will schedule a follow up appointment 2-3 weeks following discharge.  Please call us immediately for any of the following conditions  . Increased pain, redness, drainage (pus) from your incision site. . Fever of 101 degrees or higher. . If you should develop stroke (slurred speech, difficulty swallowing, weakness on one side of your body, loss of vision) you should call 911 and go to the nearest emergency room. .  Reduce your risk of vascular disease:  . Stop smoking. If you would like help call [facility] at 1-800-QUIT-NOW (1-800-784-8669) or [place] at 336-586-4000. . Manage your cholesterol . Maintain a desired weight . Control your diabetes . Keep your blood pressure down .  If you have any questions, please call the office at [phone]. 

## 2022-10-29 LAB — CBC
HCT: 34.9 % — ABNORMAL LOW (ref 36.0–46.0)
Hemoglobin: 12.3 g/dL (ref 12.0–15.0)
MCH: 31.7 pg (ref 26.0–34.0)
MCHC: 35.2 g/dL (ref 30.0–36.0)
MCV: 89.9 fL (ref 80.0–100.0)
Platelets: 155 10*3/uL (ref 150–400)
RBC: 3.88 MIL/uL (ref 3.87–5.11)
RDW: 13.1 % (ref 11.5–15.5)
WBC: 8.3 10*3/uL (ref 4.0–10.5)
nRBC: 0 % (ref 0.0–0.2)

## 2022-10-29 LAB — LIPID PANEL
Cholesterol: 177 mg/dL (ref 0–200)
HDL: 60 mg/dL (ref >40)
LDL Cholesterol: 103 mg/dL — ABNORMAL HIGH (ref 0–99)
Total CHOL/HDL Ratio: 3 RATIO
Triglycerides: 68 mg/dL (ref <150)
VLDL: 14 mg/dL (ref 0–40)

## 2022-10-29 LAB — BASIC METABOLIC PANEL
Anion gap: 10 (ref 5–15)
BUN: 15 mg/dL (ref 8–23)
CO2: 23 mmol/L (ref 22–32)
Calcium: 8.8 mg/dL — ABNORMAL LOW (ref 8.9–10.3)
Chloride: 102 mmol/L (ref 98–111)
Creatinine, Ser: 1 mg/dL (ref 0.44–1.00)
GFR, Estimated: >60 mL/min (ref >60)
Glucose, Bld: 149 mg/dL — ABNORMAL HIGH (ref 70–99)
Potassium: 4 mmol/L (ref 3.5–5.1)
Sodium: 135 mmol/L (ref 135–145)

## 2022-10-29 LAB — GLUCOSE, CAPILLARY
Glucose-Capillary: 153 mg/dL — ABNORMAL HIGH (ref 70–99)
Glucose-Capillary: 133 mg/dL — ABNORMAL HIGH (ref 70–99)

## 2022-10-29 MED ORDER — OXYCODONE-ACETAMINOPHEN 5-325 MG PO TABS: 1.0000 | ORAL_TABLET | Freq: Four times a day (QID) | ORAL | 0 refills | Status: AC | PRN

## 2022-10-29 NOTE — Progress Notes (Signed)
  Progress Note    10/29/2022 12:07 PM 1 Day Post-Op  Subjective:  right neck pain  Vitals:   10/29/22 0807 10/29/22 1131  BP: (!) 144/71 (!) 151/66  Pulse: 84 92  Resp: 14 14  Temp: 98.8 F (37.1 C) 97.8 F (36.6 C)  SpO2: 98%     Physical Exam: Aaox3 Non labored respirations Neuro in tact  CBC    Component Value Date/Time   WBC 8.3 10/29/2022 0500   RBC 3.88 10/29/2022 0500   HGB 12.3 10/29/2022 0500   HGB 14.8 01/31/2012 1221   HCT 34.9 (L) 10/29/2022 0500   HCT 43.7 01/31/2012 1221   PLT 155 10/29/2022 0500   PLT 125 (L) 01/31/2012 1221   MCV 89.9 10/29/2022 0500   MCV 91 01/31/2012 1221   MCH 31.7 10/29/2022 0500   MCHC 35.2 10/29/2022 0500   RDW 13.1 10/29/2022 0500   RDW 13.1 01/31/2012 1221   LYMPHSABS 4.3 (H) 10/27/2019 2313   MONOABS 0.4 10/27/2019 2313   EOSABS 0.1 10/27/2019 2313   BASOSABS 0.0 10/27/2019 2313    BMET    Component Value Date/Time   NA 135 10/29/2022 0500   NA 134 (L) 01/31/2012 1221   K 4.0 10/29/2022 0500   K 4.0 01/31/2012 1221   CL 102 10/29/2022 0500   CL 101 01/31/2012 1221   CO2 23 10/29/2022 0500   CO2 21 01/31/2012 1221   GLUCOSE 149 (H) 10/29/2022 0500   GLUCOSE 307 (H) 01/31/2012 1221   BUN 15 10/29/2022 0500   BUN 15 01/31/2012 1221   CREATININE 1.00 10/29/2022 0500   CREATININE 0.76 01/31/2012 1221   CALCIUM 8.8 (L) 10/29/2022 0500   CALCIUM 9.4 01/31/2012 1221   GFRNONAA >60 10/29/2022 0500   GFRNONAA >60 01/31/2012 1221   GFRAA >60 10/27/2019 2313   GFRAA >60 01/31/2012 1221    INR    Component Value Date/Time   INR 1.0 10/24/2022 1350   INR 0.9 01/31/2012 1221     Intake/Output Summary (Last 24 hours) at 10/29/2022 1207 Last data filed at 10/29/2022 0606 Gross per 24 hour  Intake 2883.42 ml  Output 1800 ml  Net 1083.42 ml     Assessment:  62 y.o. female is s/p R CEA  Plan: Ok for d/c today   Marks Scalera C. Donzetta Matters, MD [facility] Office:  [phone] Pager: [phone]  10/29/2022 12:07 PM

## 2022-10-29 NOTE — Discharge Summary (Signed)
Discharge Summary     Felicia Chavez Dec 14, 1960 62 y.o. female  VC:[id number]  Admission Date: 10/28/2022  Discharge Date: 10/29/2022  Physician: Thomes Lolling*  Admission Diagnosis: Carotid artery stenosis, asymptomatic, right [I65.21]   HPI:   This is a 62 y.o. female  who is here today for evaluation.  She has a complex past history.  She did have a history of prior left brain stroke.  I have reviewed her records and imaging from that time.  She had a cerebral arteriogram in February 2014 showing a left ACA occlusion.  She was treated medically for her stroke.  She continues to have a right arm deficit.  She is right arm dominant.  Not have any leg deficit and no speech deficit.  She has not had any follow-up duplex for quite some time and recently was seen by Dr. Domenic Polite for cardiac evaluation.  He ordered a carotid duplex follow-up and this revealed severe right internal carotid artery stenosis.  No significant extracranial disease on the left.  She is seeing me today for further discussion.  She does not have any prior right brain events.   Hospital Course:  The patient was admitted to the hospital and taken to the operating room on 10/28/2022 and underwent right CEA    Findings: The right carotid bifurcation was located very high and required division of the digastric just from visualization of the bifurcation. The hypoglossal nerve was mobilized and the ansa cervicalis was divided for better visualization as well. The lesion itself was very focal just at the bifurcation and at completion the patch angioplasty measured only approximately 3 cm in length and at completion there was very strong signal with low resistance distal to the patch angioplasty site. A 10 French shunt was placed for neuroprotection during the procedure as the backbleeding was minimal in a patient with known contralateral ACA occlusion.   The pt tolerated the procedure well and was transported to the  PACU in good condition.   By POD 1, the pt neuro status was in tact.  She was doing well and discharged home.    Recent Labs    10/29/22 0500  NA 135  K 4.0  CL 102  CO2 23  GLUCOSE 149*  BUN 15  CALCIUM 8.8*   Recent Labs    10/28/22 1658 10/29/22 0500  WBC 10.1 8.3  HGB 12.2 12.3  HCT 34.2* 34.9*  PLT 152 155   No results for input(s): "INR" in the last 72 hours.   Discharge Instructions     Discharge patient   Complete by: As directed    Discharge home once she has voided, walked and eaten.   Discharge disposition: 01-Home or Self Care   Discharge patient date: 10/29/2022       Discharge Diagnosis:  Carotid artery stenosis, asymptomatic, right [I65.21]  Secondary Diagnosis: Patient Active Problem List   Diagnosis Date Noted   Carotid artery stenosis, asymptomatic, right 10/28/2022   Type 2 diabetes mellitus without complication, without long-term current use of insulin ([place]) 07/14/2016   Pulmonary nodule 12/18/2015   Tobacco abuse 12/18/2015   Stroke ([place])    HTN (hypertension) 09/20/2012   HLD (hyperlipidemia) 09/20/2012   H/O: stroke with residual effects 09/20/2012   Past Medical History:  Diagnosis Date   Arthritis    Carotid artery disease ([place])    Essential hypertension    GERD (gastroesophageal reflux disease)    History of stroke    2013 and 2014  Mixed hyperlipidemia    Obesity    PAD (peripheral artery disease) (HCC)    Pulmonary nodule    Type 2 diabetes mellitus (HCC)    Vitamin D deficiency disease     Allergies as of 10/29/2022       Reactions   Lisinopril Swelling   Facial swelling        Medication List     TAKE these medications    acetaminophen 500 MG tablet Commonly known as: TYLENOL Take 500-1,000 mg by mouth at bedtime as needed for moderate pain or headache.   amLODipine 5 MG tablet Commonly known as: NORVASC Take 5 mg by mouth daily.   aspirin EC 81 MG tablet Take 1 tablet (81 mg total) by mouth  daily.   atorvastatin 80 MG tablet Commonly known as: LIPITOR Take 1 tablet (80 mg total) by mouth daily.   carvedilol 6.25 MG tablet Commonly known as: COREG Take 1 tablet (6.25 mg total) by mouth 2 (two) times daily.   clopidogrel 75 MG tablet Commonly known as: PLAVIX Take 1 tablet (75 mg total) by mouth daily with breakfast.   Farxiga 10 MG Tabs tablet Generic drug: dapagliflozin propanediol Take 10 mg by mouth daily.   gabapentin 100 MG capsule Commonly known as: NEURONTIN Take 1 capsule (100 mg total) by mouth 3 (three) times daily.   glipiZIDE 5 MG 24 hr tablet Commonly known as: GLUCOTROL XL Take 1 tablet (5 mg total) by mouth daily.   glucose monitoring kit monitoring kit 1 each by Does not apply route 4 (four) times daily - after meals and at bedtime. 1 month Diabetic Testing Supplies for QAC-QHS accuchecks.   Janumet 50-1000 MG tablet Generic drug: sitaGLIPtin-metformin Take 1 tablet by mouth 2 (two) times daily with a meal.   losartan 50 MG tablet Commonly known as: COZAAR Take 50 mg by mouth daily.   oxyCODONE-acetaminophen 5-325 MG tablet Commonly known as: Percocet Take 1 tablet by mouth every 6 (six) hours as needed for severe pain.   Vitamin D (Ergocalciferol) 1.25 MG (50000 UNIT) Caps capsule Commonly known as: DRISDOL Take 50,000 Units by mouth every Wednesday.         [facility] Discharge Instructions Carotid Endarterectomy (CEA)  Please refer to the following instructions for your post-procedure care. Your surgeon or physician assistant will discuss any changes with you.  Activity  You are encouraged to walk as much as you can. You can slowly return to normal activities but must avoid strenuous activity and heavy lifting until your doctor tell you it's OK. Avoid activities such as vacuuming or swinging a golf club. You can drive after one week if you are comfortable and you are no longer taking prescription  pain medications. It is normal to feel tired for serval weeks after your surgery. It is also normal to have difficulty with sleep habits, eating, and bowel movements after surgery. These will go away with time.  Bathing/Showering  You may shower after you come home. Do not soak in a bathtub, hot tub, or swim until the incision heals completely.  Incision Care  Shower every day. Clean your incision with mild soap and water. Pat the area dry with a clean towel. You do not need a bandage unless otherwise instructed. Do not apply any ointments or creams to your incision. You may have skin glue on your incision. Do not peel it off. It will come off on its own in about one week.  Your incision may feel thickened and raised for several weeks after your surgery. This is normal and the skin will soften over time. For Men Only: It's OK to shave around the incision but do not shave the incision itself for 2 weeks. It is common to have numbness under your chin that could last for several months.  Diet  Resume your normal diet. There are no special food restrictions following this procedure. A low fat/low cholesterol diet is recommended for all patients with vascular disease. In order to heal from your surgery, it is CRITICAL to get adequate nutrition. Your body requires vitamins, minerals, and protein. Vegetables are the best source of vitamins and minerals. Vegetables also provide the perfect balance of protein. Processed food has little nutritional value, so try to avoid this.  Medications  Resume taking all of your medications unless your doctor or physician assistant tells you not to.  If your incision is causing pain, you may take over-the- counter pain relievers such as acetaminophen (Tylenol). If you were prescribed a stronger pain medication, please be aware these medications can cause nausea and constipation.  Prevent nausea by taking the medication with a snack or meal. Avoid constipation by drinking  plenty of fluids and eating foods with a high amount of fiber, such as fruits, vegetables, and grains.  Do not take Tylenol if you are taking prescription pain medications.  Follow Up  Our office will schedule a follow up appointment 2-3 weeks following discharge.  Please call us immediately for any of the following conditions  Increased pain, redness, drainage (pus) from your incision site. Fever of 101 degrees or higher. If you should develop stroke (slurred speech, difficulty swallowing, weakness on one side of your body, loss of vision) you should call 911 and go to the nearest emergency room.  Reduce your risk of vascular disease:  Stop smoking. If you would like help call [facility] at 1-800-QUIT-NOW (210) 860-3501) or [place] at 580-732-6369. Manage your cholesterol Maintain a desired weight Control your diabetes Keep your blood pressure down  If you have any questions, please call the office at [phone].  Prescriptions given: 1.   Roxicet #8 No Refill  Disposition: home  Patient's condition: is Good  Follow up: 1. [facility] [place] office in 2-3 weeks    Leontine Locket, PA-C [facility] [phone]   --- For [facility] Registry use ---   Modified Rankin score at D/C (0-6): 0  IV medication needed for:  1. Hypertension: No 2. Hypotension: No  Post-op Complications: No  1. Post-op CVA or TIA: No  If yes: Event classification (right eye, left eye, right cortical, left cortical, verterobasilar, other): n/a  If yes: Timing of event (intra-op, <6 hrs post-op, >=6 hrs post-op, unknown): n/a  2. CN injury: No  If yes: CN n/a injuried   3. Myocardial infarction: No  If yes: Dx by (EKG or clinical, Troponin): n/a  4.  CHF: No  5.  Dysrhythmia (new): No  6. Wound infection: No  7. Reperfusion symptoms: No  8. Return to OR: No  If yes: return to OR for (bleeding, neurologic, other CEA incision, other): n/a  Discharge  medications: Statin use:  Yes ASA use:  Yes   Beta blocker use:  Yes ACE-Inhibitor use:  No  ARB use:  Yes CCB use: Yes P2Y12 Antagonist use: Yes, '[ ]'$  Plavix, '[ ]'$  Plasugrel, '[ ]'$  Ticlopinine, '[ ]'$  Ticagrelor, '[ ]'$  Other, '[ ]'$  No for medical reason, '[ ]'$  Non-compliant, '[ ]'$   Not-indicated Anti-coagulant use:  No, '[ ]'$  Warfarin, '[ ]'$  Rivaroxaban, '[ ]'$  Dabigatran,

## 2022-10-29 NOTE — Progress Notes (Signed)
Discharge instructions given. Patient verbalized understanding and all questions were answered.  ?

## 2022-10-29 NOTE — Anesthesia Postprocedure Evaluation (Signed)
Anesthesia Post Note  Patient: Felicia Chavez  Procedure(s) Performed: ENDARTERECTOMY CAROTID (Right: Neck) PATCH ANGIOPLASTY USING 1CM X 6CM XENOSURE BIOLOGIC PATCH (Right: Neck)     Patient location during evaluation: PACU Anesthesia Type: General Level of consciousness: awake and alert Pain management: pain level controlled Vital Signs Assessment: post-procedure vital signs reviewed and stable Respiratory status: spontaneous breathing, nonlabored ventilation, respiratory function stable and patient connected to nasal cannula oxygen Cardiovascular status: blood pressure returned to baseline and stable Postop Assessment: no apparent nausea or vomiting Anesthetic complications: no   No notable events documented.  Last Vitals:  Vitals:   10/29/22 0134 10/29/22 0337  BP: (!) 153/78 (!) 147/75  Pulse: 92 86  Resp: 18 14  Temp: 37.2 C 37.2 C  SpO2: 97% 95%    Last Pain:  Vitals:   10/29/22 0337  TempSrc: Oral  PainSc: 0-No pain                 March Rummage Peirce Deveney

## 2022-10-30 ENCOUNTER — Encounter (HOSPITAL_COMMUNITY): Payer: Self-pay | Admitting: Vascular Surgery

## 2022-11-19 ENCOUNTER — Encounter: Payer: 59 | Admitting: Vascular Surgery

## 2022-11-20 ENCOUNTER — Other Ambulatory Visit (HOSPITAL_COMMUNITY): Payer: Self-pay | Admitting: Nephrology

## 2022-11-20 DIAGNOSIS — E1122 Type 2 diabetes mellitus with diabetic chronic kidney disease: Secondary | ICD-10-CM

## 2022-11-20 DIAGNOSIS — E1129 Type 2 diabetes mellitus with other diabetic kidney complication: Secondary | ICD-10-CM

## 2022-11-20 DIAGNOSIS — I129 Hypertensive chronic kidney disease with stage 1 through stage 4 chronic kidney disease, or unspecified chronic kidney disease: Secondary | ICD-10-CM

## 2022-11-20 DIAGNOSIS — I6523 Occlusion and stenosis of bilateral carotid arteries: Secondary | ICD-10-CM

## 2022-12-03 ENCOUNTER — Ambulatory Visit (HOSPITAL_COMMUNITY)
Admission: RE | Admit: 2022-12-03 | Discharge: 2022-12-03 | Disposition: A | Discharge status: Home / Self Care | Payer: 59 | Source: Ambulatory Visit | Attending: Nephrology | Admitting: Nephrology

## 2022-12-03 DIAGNOSIS — I129 Hypertensive chronic kidney disease with stage 1 through stage 4 chronic kidney disease, or unspecified chronic kidney disease: Secondary | ICD-10-CM | POA: Diagnosis present

## 2022-12-03 DIAGNOSIS — I6523 Occlusion and stenosis of bilateral carotid arteries: Secondary | ICD-10-CM | POA: Insufficient documentation

## 2022-12-03 DIAGNOSIS — E1129 Type 2 diabetes mellitus with other diabetic kidney complication: Secondary | ICD-10-CM | POA: Insufficient documentation

## 2022-12-03 DIAGNOSIS — E1122 Type 2 diabetes mellitus with diabetic chronic kidney disease: Secondary | ICD-10-CM | POA: Insufficient documentation

## 2022-12-03 DIAGNOSIS — R809 Proteinuria, unspecified: Secondary | ICD-10-CM | POA: Insufficient documentation

## 2023-01-05 ENCOUNTER — Encounter: Payer: Self-pay | Admitting: Medical

## 2023-01-05 ENCOUNTER — Ambulatory Visit: Payer: 59 | Attending: Medical | Admitting: Medical

## 2023-01-05 VITALS — BP 112/60 | HR 86 | Ht 62.0 in | Wt 177.0 lb

## 2023-01-05 DIAGNOSIS — I5032 Chronic diastolic (congestive) heart failure: Secondary | ICD-10-CM

## 2023-01-05 DIAGNOSIS — I6521 Occlusion and stenosis of right carotid artery: Secondary | ICD-10-CM

## 2023-01-05 DIAGNOSIS — I10 Essential (primary) hypertension: Secondary | ICD-10-CM

## 2023-01-05 DIAGNOSIS — E782 Mixed hyperlipidemia: Secondary | ICD-10-CM

## 2023-01-05 DIAGNOSIS — I5020 Unspecified systolic (congestive) heart failure: Secondary | ICD-10-CM

## 2023-01-05 NOTE — Patient Instructions (Signed)
Medication Instructions:  Your physician recommends that you continue on your current medications as directed. Please refer to the Current Medication list given to you today.   Labwork: Fasting lipids with Direct LDL  Testing/Procedures: None today  Follow-Up: 3 months  Any Other Special Instructions Will Be Listed Below (If Applicable).  If you need a refill on your cardiac medications before your next appointment, please call your pharmacy.

## 2023-01-05 NOTE — Progress Notes (Signed)
Cardiology Office Note:    Date:  01/05/2023   ID:  Felicia Chavez, DOB 1961/08/10, MRN [id number]  PCP:  Smith Robert, MD  [facility] Cardiologist:  Nona Dell, MD  [facility] Electrophysiologist:  None   Referring MD: Smith Robert, MD   Chief Complaint: 3 month follow-up  History of Present Illness:    Felicia Chavez is a 62 y.o. female with a hx of carotid artery disease, mixed HLD, HFmrEF, prior stroke, DM2, HTN who presents for 3 month follow-up.   Carotid dopplers in 2017 showed 60-70% RICA stenosis and 4-09% LIC stenosis.   Ehco 2021 showed LVEF 40-45%, global HK. This was in the setting of acute hypoxic respiratory failure and uncontrolled HTN with medication noncompliance.   The patient started following with [facility] in 08/2022. Follow-up echo showed LVEF 50-555, no WMA, G1DD, normal RVSF, trivial MR.   Last seen 10/2022 for pre-op visit for right carotid endarterectomy. Patient was stable from a cardiac perspective and no further work-up was pursued.   Patient underwent right carotid endarterectomy 10/28/22  Today, the patient is overall doing well. The patient denies chest pain or shortness of breath. Vital are good today. No lower leg edema, orthopnea or pnd.   Past Medical History:  Diagnosis Date   Arthritis    Carotid artery disease (HCC)    Essential hypertension    GERD (gastroesophageal reflux disease)    History of stroke    2013 and 2014   Mixed hyperlipidemia    Obesity    PAD (peripheral artery disease) (HCC)    Pulmonary nodule    Type 2 diabetes mellitus (HCC)    Vitamin D deficiency disease     Past Surgical History:  Procedure Laterality Date   ENDARTERECTOMY Right 10/28/2022   Procedure: ENDARTERECTOMY CAROTID;  Surgeon: Maeola Harman, MD;  Location: [facility] OR;  Service: Vascular;  Laterality: Right;   PATCH ANGIOPLASTY Right 10/28/2022   Procedure: PATCH ANGIOPLASTY USING 1CM X 6CM XENOSURE BIOLOGIC PATCH;  Surgeon:  Maeola Harman, MD;  Location: [facility] OR;  Service: Vascular;  Laterality: Right;   RADIOLOGY WITH ANESTHESIA N/A 10/18/2012   Procedure: RADIOLOGY WITH ANESTHESIA;  Surgeon: Oneal Grout, MD;  Location: [facility] OR;  Service: Radiology;  Laterality: N/A;   TUBAL LIGATION      Current Medications: Current Meds  Medication Sig   acetaminophen (TYLENOL) 500 MG tablet Take 500-1,000 mg by mouth at bedtime as needed for moderate pain or headache.   amLODipine (NORVASC) 5 MG tablet Take 5 mg by mouth daily.   aspirin EC 81 MG tablet Take 1 tablet (81 mg total) by mouth daily.   atorvastatin (LIPITOR) 80 MG tablet Take 1 tablet (80 mg total) by mouth daily.   carvedilol (COREG) 6.25 MG tablet Take 1 tablet (6.25 mg total) by mouth 2 (two) times daily.   clopidogrel (PLAVIX) 75 MG tablet Take 1 tablet (75 mg total) by mouth daily with breakfast.   dapagliflozin propanediol (FARXIGA) 10 MG TABS tablet Take 10 mg by mouth daily.   gabapentin (NEURONTIN) 100 MG capsule Take 1 capsule (100 mg total) by mouth 3 (three) times daily.   glipiZIDE (GLUCOTROL XL) 5 MG 24 hr tablet Take 1 tablet (5 mg total) by mouth daily.   glucose monitoring kit (FREESTYLE) monitoring kit 1 each by Does not apply route 4 (four) times daily - after meals and at bedtime. 1 month Diabetic Testing Supplies for QAC-QHS accuchecks.   losartan (COZAAR)  50 MG tablet Take 50 mg by mouth daily.   oxyCODONE-acetaminophen (PERCOCET) 5-325 MG tablet Take 1 tablet by mouth every 6 (six) hours as needed for severe pain.   sitaGLIPtin-metformin (JANUMET) 50-1000 MG tablet Take 1 tablet by mouth 2 (two) times daily with a meal.   Vitamin D, Ergocalciferol, (DRISDOL) 1.25 MG (50000 UNIT) CAPS capsule Take 50,000 Units by mouth every Wednesday.     Allergies:   Lisinopril   Social History   Socioeconomic History   Marital status: Widowed    Spouse name: Not on file   Number of children: Not on file   Years of education: Not on  file   Highest education level: Not on file  Occupational History   Not on file  Tobacco Use   Smoking status: Some Days    Packs/day: 0.50    Years: 4.00    Additional pack years: 0.00    Total pack years: 2.00    Types: Cigarettes    Last attempt to quit: 04/15/2016    Years since quitting: 6.7   Smokeless tobacco: Never   Tobacco comments:    Started back 2-3 years ago - smokes about every other day 10/20/2022  Vaping Use   Vaping Use: Never used  Substance and Sexual Activity   Alcohol use: Yes    Comment: 2-3 times a week   Drug use: No   Sexual activity: Not on file  Other Topics Concern   Not on file  Social History Narrative   Not on file   Social Determinants of Health   Financial Resource Strain: Not on file  Food Insecurity: Not on file  Transportation Needs: Not on file  Physical Activity: Not on file  Stress: Not on file  Social Connections: Not on file     Family History: The patient's family history includes Hypertension in her mother.  ROS:   Please see the history of present illness.     All other systems reviewed and are negative.  EKGs/Labs/Other Studies Reviewed:    The following studies were reviewed today:  Bilateral carotid duplex on February 21st, 2024: Summary:  Right Carotid: Velocities in the right ICA are consistent with a 80-99% stenosis.   Left Carotid: There is no evidence of stenosis in the left ICA.   Vertebrals:  Bilateral vertebral arteries demonstrate antegrade flow.  Subclavians: Normal flow hemodynamics were seen in bilateral subclavian arteries.   Carotid Doppler on 10/01/2022: IMPRESSION: Right: Heterogeneous plaque at the right carotid bifurcation contributes to 70%-99% stenosis by established duplex criteria.   Left: Color duplex indicates moderate heterogeneous plaque with no hemodynamically significant stenosis by duplex criteria in the extracranial cerebrovascular circulation.   Echocardiogram on  10/01/2022: 1. Left ventricular ejection fraction, by estimation, is 50 to 55%. The  left ventricle has low normal function. The left ventricle has no regional  wall motion abnormalities. Left ventricular diastolic parameters are  consistent with Grade I diastolic  dysfunction (impaired relaxation).   2. Right ventricular systolic function is normal. The right ventricular  size is normal. Tricuspid regurgitation signal is inadequate for assessing  PA pressure.   3. The mitral valve is mildly degenerative. Trivial mitral valve  regurgitation.   4. The aortic valve is tricuspid. Aortic valve regurgitation is not  visualized.   5. The inferior vena cava is normal in size with greater than 50%  respiratory variability, suggesting right atrial pressure of 3 mmHg.   Comparison(s): Prior images reviewed side by side. LVEF  has improved in  comparison.  EKG:  EKG is not ordered today.    Recent Labs: 10/24/2022: ALT 13 10/29/2022: BUN 15; Creatinine, Ser 1.00; Hemoglobin 12.3; Platelets 155; Potassium 4.0; Sodium 135  Recent Lipid Panel    Component Value Date/Time   CHOL 177 10/29/2022 0500   TRIG 68 10/29/2022 0500   HDL 60 10/29/2022 0500   CHOLHDL 3.0 10/29/2022 0500   VLDL 14 10/29/2022 0500   LDLCALC 103 (H) 10/29/2022 0500    Physical Exam:    VS:  BP 112/60   Pulse 86   Ht 5\' 2"  (1.575 m)   Wt 177 lb (80.3 kg)   SpO2 95%   BMI 32.37 kg/m     Wt Readings from Last 3 Encounters:  01/05/23 177 lb (80.3 kg)  10/28/22 175 lb (79.4 kg)  10/24/22 (P) 175 lb 3.2 oz (79.5 kg)     GEN:  Well nourished, well developed in no acute distress HEENT: Normal NECK: No JVD; No carotid bruits LYMPHATICS: No lymphadenopathy CARDIAC: RRR, no murmurs, rubs, gallops RESPIRATORY:  Clear to auscultation without rales, wheezing or rhonchi  ABDOMEN: Soft, non-tender, non-distended MUSCULOSKELETAL:  No edema; No deformity  SKIN: Warm and dry NEUROLOGIC:  Alert and oriented x 3 PSYCHIATRIC:   Normal affect   ASSESSMENT:    1. Heart failure with improved ejection fraction (HFimpEF) (HCC)   2. Carotid artery stenosis, unilateral, right   3. Mixed hyperlipidemia   4. Essential hypertension    PLAN:    In order of problems listed above:  HFimpEF Echo in February 2024 showed improved EF of 50 to 55%.  The patient is euvolemic on exam.  Continue Coreg, Farxiga, and losartan.  Recommend low-salt diet, fluid restriction less than 2 L, and daily weights.  B/l carotid artery diseae Right Carotid artery stenosis Status post right endarterectomy.  She will follow-up with vascular surgery.  Overall, surgical site appears to be healing well. Continue Aspirin, Plavix, and statin.   HLD LDL 103, statin increased on prior visit.  I will check fasting lipids with direct LDL.  Continue atorvastatin 80 mg daily  HTN BP is good today, continue Coreg, Losartan and amlodipine.   Disposition: Follow up in 3 month(s) with MD/APP   Signed, Tereasa Yilmaz David Stall, PA-C  01/05/2023 2:02 PM    [facility]

## 2023-03-30 NOTE — Progress Notes (Deleted)
Cardiology Office Note:  .   Date:  03/30/2023  ID:  Larey Seat, DOB [birth year], MRN [id number] PCP: Smith Robert, MD  [facility] Providers Cardiologist:  Nona Dell, MD Cardiology APP:  Sharlene Dory, NP { Click to update primary MD,subspecialty MD or APP then REFRESH:1}   History of Present Illness: Marland Kitchen   Felicia Chavez is a 62 y.o. female  with a hx of carotid artery disease, mixed HLD, HFmrEF, prior stroke, DM2, HTN.    Echo 2021 showed LVEF 40-45%, global HK. This was in the setting of acute hypoxic respiratory failure and uncontrolled HTN with medication noncompliance. 08/2022. Follow-up echo showed LVEF 50-555, no WMA, G1DD, normal RVSF, trivial MR.  ROS: ***  Studies Reviewed: Marland Kitchen         Prior CV Studies: {Select studies to display:26339}  ***  Risk Assessment/Calculations:   {Does this patient have ATRIAL FIBRILLATION?:[phone]} No BP recorded.  {Refresh Note OR Click here to enter BP  :1}***       Physical Exam:   VS:  There were no vitals taken for this visit.   Wt Readings from Last 3 Encounters:  01/05/23 177 lb (80.3 kg)  10/28/22 175 lb (79.4 kg)  10/24/22 (P) 175 lb 3.2 oz (79.5 kg)    GEN: Well nourished, well developed in no acute distress NECK: No JVD; No carotid bruits CARDIAC: ***RRR, no murmurs, rubs, gallops RESPIRATORY:  Clear to auscultation without rales, wheezing or rhonchi  ABDOMEN: Soft, non-tender, non-distended EXTREMITIES:  No edema; No deformity   ASSESSMENT AND PLAN: .    HFimpEF 50-55% on echo 09/2022  RCEA   HTN  HLD     {Are you ordering a CV Procedure (e.g. stress test, cath, DCCV, TEE, etc)?   Press F2        :[id number]}  Dispo: ***  Signed, Jacolyn Reedy, PA-C

## 2023-04-13 ENCOUNTER — Ambulatory Visit: Payer: 59 | Attending: Physician Assistant | Admitting: Physician Assistant

## 2023-08-24 ENCOUNTER — Other Ambulatory Visit (HOSPITAL_COMMUNITY): Payer: Self-pay | Admitting: Nurse Practitioner

## 2023-08-24 DIAGNOSIS — Z122 Encounter for screening for malignant neoplasm of respiratory organs: Secondary | ICD-10-CM

## 2023-08-27 ENCOUNTER — Encounter: Payer: Self-pay | Admitting: *Deleted

## 2023-10-16 ENCOUNTER — Encounter (HOSPITAL_COMMUNITY): Payer: Self-pay

## 2023-10-16 ENCOUNTER — Ambulatory Visit (HOSPITAL_COMMUNITY): Payer: 59

## 2023-12-09 ENCOUNTER — Other Ambulatory Visit: Payer: Self-pay | Admitting: Cardiology

## 2023-12-10 ENCOUNTER — Encounter: Payer: Self-pay | Admitting: *Deleted

## 2024-06-10 ENCOUNTER — Encounter (INDEPENDENT_AMBULATORY_CARE_PROVIDER_SITE_OTHER): Payer: Self-pay | Admitting: *Deleted

## 2024-09-13 ENCOUNTER — Other Ambulatory Visit (HOSPITAL_COMMUNITY): Payer: Self-pay | Admitting: Nurse Practitioner

## 2024-09-13 DIAGNOSIS — Z1231 Encounter for screening mammogram for malignant neoplasm of breast: Secondary | ICD-10-CM

## 2024-09-23 ENCOUNTER — Ambulatory Visit (HOSPITAL_COMMUNITY)
